# Patient Record
Sex: Female | Born: 1995 | Race: Black or African American | Hispanic: No | Marital: Single | Smoking: Never smoker
Health system: Southern US, Community
[De-identification: ages and names within clinical notes are randomized; demographics above are authoritative.]

---
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
## 2014-03-05 ENCOUNTER — Emergency Department (HOSPITAL_COMMUNITY)
Admission: EM | Admit: 2014-03-05 | Discharge: 2014-03-05 | Disposition: A | Discharge status: Home / Self Care | Payer: Medicaid Other | Attending: Emergency Medicine | Admitting: Emergency Medicine

## 2014-03-05 ENCOUNTER — Encounter (HOSPITAL_COMMUNITY): Payer: Self-pay

## 2014-03-05 DIAGNOSIS — M546 Pain in thoracic spine: Secondary | ICD-10-CM | POA: Insufficient documentation

## 2014-03-05 DIAGNOSIS — J029 Acute pharyngitis, unspecified: Secondary | ICD-10-CM | POA: Diagnosis not present

## 2014-03-05 DIAGNOSIS — Z79899 Other long term (current) drug therapy: Secondary | ICD-10-CM | POA: Insufficient documentation

## 2014-03-05 DIAGNOSIS — M7918 Myalgia, other site: Secondary | ICD-10-CM

## 2014-03-05 DIAGNOSIS — M549 Dorsalgia, unspecified: Secondary | ICD-10-CM

## 2014-03-05 DIAGNOSIS — M25512 Pain in left shoulder: Secondary | ICD-10-CM | POA: Diagnosis present

## 2014-03-05 LAB — RAPID STREP SCREEN (MED CTR MEBANE ONLY): Streptococcus, Group A Screen (Direct): NEGATIVE

## 2014-03-05 MED ORDER — IBUPROFEN 800 MG PO TABS: 800.0000 mg | ORAL_TABLET | Freq: Three times a day (TID) | ORAL | Status: DC | PRN

## 2014-03-05 MED ORDER — CYCLOBENZAPRINE HCL 10 MG PO TABS: 10.0000 mg | ORAL_TABLET | Freq: Three times a day (TID) | ORAL | Status: DC | PRN

## 2014-03-05 NOTE — ED Provider Notes (Signed)
CSN: [id number]     Arrival date & time 03/05/14  1310 History  This chart was scribed for non-physician practitioner, Trixie Dredge, PA-C, working with Elwin Mocha, MD by Charline Bills, ED Scribe. This patient was seen in room TR10C/TR10C and the patient's care was started at 2:35 PM.   Chief Complaint  Patient presents with  . Shoulder Pain  . Sore Throat   The history is provided by the patient. No language interpreter was used.   HPI Comments: Dorothy Sexton is a 19 y.o. female who presents to the Emergency Department complaining of constant, gradually worsening L shoulder pain for the past month. Pt describes pain as a constant, stabbing sensation that radiates from L shoulder across her upper back and into R shoulder. Pain is exacerbated with standing for extended periods of time. Pain is not recreated with deep breathing or walking. Pt suspected that pain was attributed to her bra strap. She denies heavy lifting or falls. Pt also denies neck pain, weakness, numbness. Pt is R hand dominant. She has tried treating with one of her mother's muscle relaxants which provided no relief and a heating pad which caused a burning sensation.   Pt also presents with a sudden onset of sore throat first noted PTA. Pt describes pain as soreness that is exacerbated with swallowing. Pt was treated for strep throat a few months ago. She denies fever, cough, congestion, SOB. No sick contacts.      History reviewed. No pertinent past medical history. History reviewed. No pertinent past surgical history. History reviewed. No pertinent family history. History  Substance Use Topics  . Smoking status: Never Smoker   . Smokeless tobacco: Not on file  . Alcohol Use: No   OB History    No data available     Review of Systems  Constitutional: Negative for fever.  HENT: Positive for sore throat. Negative for congestion.   Respiratory: Negative for cough and shortness of breath.   Musculoskeletal: Positive  for back pain and arthralgias. Negative for neck pain.  Neurological: Negative for weakness and numbness.  All other systems reviewed and are negative.  Allergies  Review of patient's allergies indicates no known allergies.  Home Medications   Prior to Admission medications   Medication Sig Start Date End Date Taking? Authorizing Provider  metroNIDAZOLE (FLAGYL) 500 MG tablet Take 1 tablet (500 mg total) by mouth 2 (two) times daily. 01/28/13   Imagene Sheller, MD   There were no vitals taken for this visit. Physical Exam  Constitutional: She appears well-developed and well-nourished. No distress.  HENT:  Head: Normocephalic and atraumatic.  Mouth/Throat: Oropharynx is clear and moist. No oropharyngeal exudate or posterior oropharyngeal erythema.  Bilateral symmetrical tonsil enlargement. No erythema or exudate.     Eyes: Conjunctivae are normal.  Neck: Neck supple.  Cardiovascular: Normal rate and regular rhythm.   Pulmonary/Chest: Effort normal and breath sounds normal. No respiratory distress. She has no wheezes. She has no rales.  Abdominal: Soft. She exhibits no distension and no mass. There is no tenderness. There is no rebound and no guarding.  Musculoskeletal:  L trapezius tender. Spine nontender, no crepitus, or stepoffs. Upper extremities:  Strength 5/5, sensation intact, distal pulses intact.       Neurological: She is alert.  Skin: She is not diaphoretic.  Nursing note reviewed.   ED Course  Procedures (including critical care time)   COORDINATION OF CARE: 2:41 PM-Discussed treatment plan which includes strep culture, strep screen, ibuprofen and  Flexeril with pt at bedside and pt agreed to plan.   Labs Review Labs Reviewed  RAPID STREP SCREEN  CULTURE, GROUP A STREP   Imaging Review No results found.   EKG Interpretation None      MDM   Final diagnoses:  Upper back pain on left side  Musculoskeletal pain  Sore throat    Afebrile, nontoxic  patient with muscular upper back pain. No red flags.  Also with early sore throat that began as she came into the ED.  No concerning findings on exam.  D/C home with ibuprofen, flexeril.  Discussed result, findings, treatment, and follow up  with patient.  Pt given return precautions.  Pt verbalizes understanding and agrees with plan.      I personally performed the services described in this documentation, which was scribed in my presence. The recorded information has been reviewed and is accurate.    Trixie Dredgemily Masami Plata, PA-C 03/05/14 1536  Elwin MochaBlair Walden, MD 03/05/14 [phone]

## 2014-03-05 NOTE — Discharge Instructions (Signed)
Read the information below.  Use the prescribed medication as directed.  Please discuss all new medications with your pharmacist.  You may return to the Emergency Department at any time for worsening condition or any new symptoms that concern you.  If there is any possibility that you might be pregnant, please let your health care provider know and discuss this with the pharmacist to ensure medication safety.  If you develop uncontrolled pain, weakness or numbness of the extremity, severe discoloration of the skin, or you are unable to move your arm, return to the ER for a recheck.

## 2014-03-05 NOTE — ED Notes (Signed)
Pt reporting left shoulder pain radiating to right shoulder for about a month.  Pt also reporting throat hurting her.  Sts she was recently treated for strep throat.

## 2014-03-07 LAB — CULTURE, GROUP A STREP

## 2014-10-22 ENCOUNTER — Emergency Department (HOSPITAL_COMMUNITY)
Admission: EM | Admit: 2014-10-22 | Discharge: 2014-10-23 | Disposition: A | Discharge status: Home / Self Care | Payer: Medicaid Other | Attending: Emergency Medicine | Admitting: Emergency Medicine

## 2014-10-22 DIAGNOSIS — R202 Paresthesia of skin: Secondary | ICD-10-CM

## 2014-10-22 DIAGNOSIS — Z79899 Other long term (current) drug therapy: Secondary | ICD-10-CM | POA: Diagnosis not present

## 2014-10-22 DIAGNOSIS — Z720 Tobacco use: Secondary | ICD-10-CM | POA: Diagnosis not present

## 2014-10-22 DIAGNOSIS — N9089 Other specified noninflammatory disorders of vulva and perineum: Secondary | ICD-10-CM | POA: Diagnosis not present

## 2014-10-23 ENCOUNTER — Encounter (HOSPITAL_COMMUNITY): Payer: Self-pay | Admitting: Emergency Medicine

## 2014-10-23 MED ORDER — IBUPROFEN 800 MG PO TABS: 800.0000 mg | ORAL_TABLET | Freq: Three times a day (TID) | ORAL | Status: DC

## 2014-10-23 NOTE — ED Notes (Signed)
Pt states she was sitting at her dresser and her right foot was under the edge of it and when she went to get up she hit it  Pt states the whole outside of her foot is numb   Pt states she also found a bump on her vagina while shaving she would like looked at

## 2014-10-23 NOTE — ED Provider Notes (Signed)
CSN: [id number]     Arrival date & time 10/22/14  2354 History   First MD Initiated Contact with Patient 10/23/14 0010     Chief Complaint  Patient presents with  . Foot Injury     (Consider location/radiation/quality/duration/timing/severity/associated sxs/prior Treatment) Patient is a 19 y.o. female presenting with foot injury. The history is provided by the patient. No language interpreter was used.  Foot Injury Time since incident:  1 week Injury: yes   Associated symptoms: no back pain and no fever   Associated symptoms comment:  She complains of numbness in the right foot laterally that caused a near fall earlier tonight prompting emergency evaluation. She reports that she injured the foot one week ago when it got caught underneath a dresser but there has been no numbness until tonight. No new injury.   She also complains of a bump on her labia that she noticed tonight while shaving the groin area. It is non-painful, has not had any drainage or blistering.    History reviewed. No pertinent past medical history. History reviewed. No pertinent past surgical history. Family History  Problem Relation Age of Onset  . Diabetes Mother   . Stroke Other   . Diabetes Other   . Hypertension Other    Social History  Substance Use Topics  . Smoking status: Current Some Day Smoker    Types: Cigarettes  . Smokeless tobacco: None  . Alcohol Use: Yes     Comment: occ   OB History    No data available     Review of Systems  Constitutional: Negative for fever.  Genitourinary:       See HPI.  Musculoskeletal: Negative for back pain.  Neurological: Positive for numbness. Negative for weakness.      Allergies  Review of patient's allergies indicates no known allergies.  Home Medications   Prior to Admission medications   Medication Sig Start Date End Date Taking? Authorizing Provider  cyclobenzaprine (FLEXERIL) 10 MG tablet Take 1 tablet (10 mg total) by mouth 3 (three)  times daily as needed for muscle spasms (and pain). 03/05/14   Trixie Dredge, PA-C  ibuprofen (ADVIL,MOTRIN) 800 MG tablet Take 1 tablet (800 mg total) by mouth every 8 (eight) hours as needed for mild pain or moderate pain. 03/05/14   Trixie Dredge, PA-C  metroNIDAZOLE (FLAGYL) 500 MG tablet Take 1 tablet (500 mg total) by mouth 2 (two) times daily. 01/28/13   Imagene Sheller, MD   BP 132/91 mmHg  Pulse 77  Temp(Src) 98.4 F (36.9 C) (Oral)  Resp 17  SpO2 100% Physical Exam  Constitutional: She is oriented to person, place, and time. She appears well-developed and well-nourished.  Neck: Normal range of motion.  Pulmonary/Chest: Effort normal.  Genitourinary:  Small, non-pustular soft bump left labia. Nontender. No induration.  Musculoskeletal:  Right foot is unremarkable in appearance. No swelling or bony deformity. FROM with full strength on plantar and dorsiflexion of toes.   Neurological: She is alert and oriented to person, place, and time.  Decreased sensation lateral right foot.   Skin: Skin is warm and dry.    ED Course  Procedures (including critical care time) Labs Review Labs Reviewed - No data to display  Imaging Review No results found. I have personally reviewed and evaluated these images and lab results as part of my medical decision-making.   EKG Interpretation None      MDM   Final diagnoses:  None    1. Right foot  paresthesia 2. Labial lesion  Labial lesion does not follow pattern of venereal wart. There is no pustule or blister. It is non-tender. Suggested observation and follow up if symptoms do not resolve in the next 1 week.   Foot is normal to exam. She is ambulatory. Suggested follow up with neurology if numbness continues for possible nerve conduction evaluation. Likely secondary to recent injury and self limited.     Elpidio Anis, PA-C 10/23/14 0034  April Palumbo, MD 10/23/14 [phone]

## 2014-10-23 NOTE — Discharge Instructions (Signed)

## 2014-12-02 ENCOUNTER — Emergency Department (HOSPITAL_COMMUNITY): Payer: Medicaid Other

## 2014-12-02 ENCOUNTER — Encounter (HOSPITAL_COMMUNITY): Payer: Self-pay | Admitting: Emergency Medicine

## 2014-12-02 ENCOUNTER — Emergency Department (HOSPITAL_COMMUNITY)
Admission: EM | Admit: 2014-12-02 | Discharge: 2014-12-02 | Disposition: A | Discharge status: Home / Self Care | Payer: Medicaid Other | Attending: Emergency Medicine | Admitting: Emergency Medicine

## 2014-12-02 DIAGNOSIS — S199XXA Unspecified injury of neck, initial encounter: Secondary | ICD-10-CM | POA: Insufficient documentation

## 2014-12-02 DIAGNOSIS — S66911A Strain of unspecified muscle, fascia and tendon at wrist and hand level, right hand, initial encounter: Secondary | ICD-10-CM

## 2014-12-02 DIAGNOSIS — S0033XA Contusion of nose, initial encounter: Secondary | ICD-10-CM | POA: Insufficient documentation

## 2014-12-02 DIAGNOSIS — S0083XA Contusion of other part of head, initial encounter: Secondary | ICD-10-CM | POA: Insufficient documentation

## 2014-12-02 DIAGNOSIS — F17210 Nicotine dependence, cigarettes, uncomplicated: Secondary | ICD-10-CM | POA: Diagnosis not present

## 2014-12-02 DIAGNOSIS — Y998 Other external cause status: Secondary | ICD-10-CM | POA: Insufficient documentation

## 2014-12-02 DIAGNOSIS — S66211A Strain of extensor muscle, fascia and tendon of right thumb at wrist and hand level, initial encounter: Secondary | ICD-10-CM | POA: Diagnosis not present

## 2014-12-02 DIAGNOSIS — S299XXA Unspecified injury of thorax, initial encounter: Secondary | ICD-10-CM | POA: Diagnosis not present

## 2014-12-02 DIAGNOSIS — Z792 Long term (current) use of antibiotics: Secondary | ICD-10-CM | POA: Diagnosis not present

## 2014-12-02 DIAGNOSIS — Y9389 Activity, other specified: Secondary | ICD-10-CM | POA: Diagnosis not present

## 2014-12-02 DIAGNOSIS — Y9289 Other specified places as the place of occurrence of the external cause: Secondary | ICD-10-CM | POA: Insufficient documentation

## 2014-12-02 DIAGNOSIS — Z791 Long term (current) use of non-steroidal anti-inflammatories (NSAID): Secondary | ICD-10-CM | POA: Diagnosis not present

## 2014-12-02 DIAGNOSIS — S6991XA Unspecified injury of right wrist, hand and finger(s), initial encounter: Secondary | ICD-10-CM | POA: Diagnosis present

## 2014-12-02 DIAGNOSIS — IMO0001 Reserved for inherently not codable concepts without codable children: Secondary | ICD-10-CM

## 2014-12-02 MED ORDER — CYCLOBENZAPRINE HCL 10 MG PO TABS: 10.0000 mg | ORAL_TABLET | Freq: Two times a day (BID) | ORAL | Status: DC | PRN

## 2014-12-02 MED ORDER — DIAZEPAM 2 MG PO TABS
2.0000 mg | ORAL_TABLET | Freq: Once | ORAL | Status: AC
  Administered 2014-12-02: 2 mg via ORAL
  Filled 2014-12-02: qty 1

## 2014-12-02 MED ORDER — OXYCODONE-ACETAMINOPHEN 5-325 MG PO TABS: 1.0000 | ORAL_TABLET | Freq: Four times a day (QID) | ORAL | Status: DC | PRN

## 2014-12-02 MED ORDER — HYDROCODONE-ACETAMINOPHEN 5-325 MG PO TABS
2.0000 | ORAL_TABLET | Freq: Once | ORAL | Status: AC
  Administered 2014-12-02: 2 via ORAL
  Filled 2014-12-02: qty 2

## 2014-12-02 MED ORDER — DIAZEPAM 5 MG PO TABS: 5.0000 mg | ORAL_TABLET | Freq: Two times a day (BID) | ORAL | Status: DC | PRN

## 2014-12-02 NOTE — Discharge Instructions (Signed)
Heat Therapy Heat therapy can help ease sore, stiff, injured, and tight muscles and joints. Heat relaxes your muscles, which may help ease your pain.  RISKS AND COMPLICATIONS If you have any of the following conditions, do not use heat therapy unless your health care provider has approved:  Poor circulation.  Healing wounds or scarred skin in the area being treated.  Diabetes, heart disease, or high blood pressure.  Not being able to feel (numbness) the area being treated.  Unusual swelling of the area being treated.  Active infections.  Blood clots.  Cancer.  Inability to communicate pain. This may include young children and people who have problems with their brain function (dementia).  Pregnancy. Heat therapy should only be used on old, pre-existing, or long-lasting (chronic) injuries. Do not use heat therapy on new injuries unless directed by your health care provider. HOW TO USE HEAT THERAPY There are several different kinds of heat therapy, including:  Moist heat pack.  Warm water bath.  Hot water bottle.  Electric heating pad.  Heated gel pack.  Heated wrap.  Electric heating pad. Use the heat therapy method suggested by your health care provider. Follow your health care provider's instructions on when and how to use heat therapy. GENERAL HEAT THERAPY RECOMMENDATIONS  Do not sleep while using heat therapy. Only use heat therapy while you are awake.  Your skin may turn pink while using heat therapy. Do not use heat therapy if your skin turns red.  Do not use heat therapy if you have new pain.  High heat or long exposure to heat can cause burns. Be careful when using heat therapy to avoid burning your skin.  Do not use heat therapy on areas of your skin that are already irritated, such as with a rash or sunburn. SEEK MEDICAL CARE IF:  You have blisters, redness, swelling, or numbness.  You have new pain.  Your pain is worse. MAKE SURE  YOU:  Understand these instructions.  Will watch your condition.  Will get help right away if you are not doing well or get worse.   This information is not intended to replace advice given to you by your health care provider. Make sure you discuss any questions you have with your health care provider.   Document Released: 03/30/2011 Document Revised: 01/26/2014 Document Reviewed: 02/28/2013 Elsevier Interactive Patient Education 2016 [employer] Inc[employer]  Cryotherapy Cryotherapy is when you put ice on your injury. Ice helps lessen pain and puffiness (swelling) after an injury. Ice works the best when you start using it in the first 24 to 48 hours after an injury. HOME CARE  Put a dry or damp towel between the ice pack and your skin.  You may press gently on the ice pack.  Leave the ice on for no more than 10 to 20 minutes at a time.  Check your skin after 5 minutes to make sure your skin is okay.  Rest at least 20 minutes between ice pack uses.  Stop using ice when your skin loses feeling (numbness).  Do not use ice on someone who cannot tell you when it hurts. This includes small children and people with memory problems (dementia). GET HELP RIGHT AWAY IF:  You have white spots on your skin.  Your skin turns blue or pale.  Your skin feels waxy or hard.  Your puffiness gets worse. MAKE SURE YOU:   Understand these instructions.  Will watch your condition.  Will get help right away if you  are not doing well or get worse.   This information is not intended to replace advice given to you by your health care provider. Make sure you discuss any questions you have with your health care provider.   Document Released: 06/24/2007 Document Revised: 03/30/2011 Document Reviewed: 08/28/2010 Elsevier Interactive Patient Education 2016 Else[employer] Inc.  Musculoskeletal Pain Musculoskeletal pain is muscle and boney aches and pains. These pains can occur in any part of the body. Your  caregiver may treat you without knowing the cause of the pain. They may treat you if blood or urine tests, X-rays, and other tests were normal.  CAUSES There is often not a definite cause or reason for these pains. These pains may be caused by a type of germ (virus). The discomfort may also come from overuse. Overuse includes working out too hard when your body is not fit. Boney aches also come from weather changes. Bone is sensitive to atmospheric pressure changes. HOME CARE INSTRUCTIONS   Ask when your test results will be ready. Make sure you get your test results.  Only take over-the-counter or prescription medicines for pain, discomfort, or fever as directed by your caregiver. If you were given medications for your condition, do not drive, operate machinery or power tools, or sign legal documents for 24 hours. Do not drink alcohol. Do not take sleeping pills or other medications that may interfere with treatment.  Continue all activities unless the activities cause more pain. When the pain lessens, slowly resume normal activities. Gradually increase the intensity and duration of the activities or exercise.  During periods of severe pain, bed rest may be helpful. Lay or sit in any position that is comfortable.  Putting ice on the injured area.  Put ice in a bag.  Place a towel between your skin and the bag.  Leave the ice on for 15 to 20 minutes, 3 to 4 times a day.  Follow up with your caregiver for continued problems and no reason can be found for the pain. If the pain becomes worse or does not go away, it may be necessary to repeat tests or do additional testing. Your caregiver may need to look further for a possible cause. SEEK IMMEDIATE MEDICAL CARE IF:  You have pain that is getting worse and is not relieved by medications.  You develop chest pain that is associated with shortness or breath, sweating, feeling sick to your stomach (nauseous), or throw up (vomit).  Your pain  becomes localized to the abdomen.  You develop any new symptoms that seem different or that concern you. MAKE SURE YOU:   Understand these instructions.  Will watch your condition.  Will get help right away if you are not doing well or get worse.   This information is not intended to replace advice given to you by your health care provider. Make sure you discuss any questions you have with your health care provider.   Document Released: 01/05/2005 Document Revised: 03/30/2011 Document Reviewed: 09/09/2012 Elsevier Interactive Patient Education 2016 Else[employer] Inc.  Thumb Sprain A thumb sprain is an injury to one of the strong bands of tissue (ligaments) that connect the bones in your thumb. The ligament can be stretched too much or it can tear. A tear can be either partial or complete. The severity of the sprain depends on how much of the ligament was damaged or torn. CAUSES A thumb sprain is often caused by a fall or an accident. If you extend your hands to catch  an object or to protect yourself, the force of the impact can cause your ligament to stretch too much. This excess tension can also cause your ligament to tear. RISK FACTORS This injury is more likely to occur in people who play:  Sports that involve a greater risk of falling, such as skiing.  Sports that involve catching an object, such as basketball. SYMPTOMS Symptoms of this condition include:  Loss of motion in your thumb.  Bruising.  Tenderness.  Swelling. DIAGNOSIS This condition is diagnosed with a medical history and physical exam. You may also have an X-ray of your thumb. TREATMENT Treatment varies depending on the severity of your sprain. If your ligament is overstretched or partially torn, treatment usually involves keeping your thumb in a fixed position (immobilization) for a period of time. To help you do this, your health care provider will apply a bandage, cast, or splint to keep your thumb from moving  until it heals. If your ligament is fully torn, you may need surgery to reconnect the ligament to the bone. After surgery, a cast or splint will be applied and will need to stay on your thumb while it heals. Your health care provider may also suggest exercises or physical therapy to strengthen your thumb. HOME CARE INSTRUCTIONS If You Have a Cast:  Do not stick anything inside the cast to scratch your skin. Doing that increases your risk of infection.  Check the skin around the cast every day. Report any concerns to your health care provider. You may put lotion on dry skin around the edges of the cast. Do not apply lotion to the skin underneath the cast.  Keep the cast clean and dry. If You Have a Splint:  Wear it as directed by your health care provider. Remove it only as directed by your health care provider.  Loosen the splint if your fingers become numb and tingle, or if they turn cold and blue.  Keep the splint clean and dry. Bathing  Cover the bandage, cast, or splint with a watertight plastic bag to protect it from water while you take a bath or a shower. Do not let the bandage, cast, or splint get wet. Managing Pain, Stiffness, and Swelling   If directed, apply ice to the injured area (unless you have a cast):  Put ice in a plastic bag.  Place a towel between your skin and the bag.  Leave the ice on for 20 minutes, 2-3 times per day.  Move your fingers often to avoid stiffness and to lessen swelling.  Raise (elevate) the injured area above the level of your heart while you are sitting or lying down. Driving  Do not drive or operate heavy machinery while taking pain medicine.  Do not drive while wearing a cast or splint on a hand that you use for driving. General Instructions  Do not put pressure on any part of your cast or splint until it is fully hardened. This may take several hours.  Take medicines only as directed by your health care provider. These include  over-the-counter medicines and prescription medicines.  Keep all follow-up visits as directed by your health care provider. This is important.  Do any exercise or physical therapy as directed by your health care provider.  Do not wear rings on your injured thumb. SEEK MEDICAL CARE IF:  Your pain is not controlled with medicine.  Your bruising or swelling gets worse.  Your cast or splint is damaged. SEEK IMMEDIATE MEDICAL CARE IF:  Your thumb is numb or blue.  Your thumb feels colder than normal.   This information is not intended to replace advice given to you by your health care provider. Make sure you discuss any questions you have with your health care provider.   Document Released: 02/13/2004 Document Revised: 05/22/2014 Document Reviewed: 10/17/2013 Elsevier Interactive Patient Education 2016 Else[employer] Inc.  Jaw Contusion A jaw contusion is a deep bruise of the jaw. Contusions are the result of an injury to muscles and tissue under the skin, which causes bleeding under the skin. The contusion may turn blue, purple, or yellow. Minor injuries will cause a painless contusion, but more severe contusions may stay painful and swollen for a few weeks. CAUSES This condition is usually caused by trauma, direct force, or a hard hit (blow) to the jaw. SYMPTOMS Symptoms of this condition include:  Jaw pain.  Jaw swelling.  Jaw bruising, redness, or discoloration.  Jaw tenderness or soreness. DIAGNOSIS This condition is diagnosed with a medical history and a physical exam. An X-ray, CT scan, or MRI may be needed to determine if there were any associated injuries, such as broken bones (fractures). TREATMENT Often, the best treatment for this condition is applying cold compresses to the injured area and eating a soft diet. Over-the-counter medicines may also be recommended for pain control. HOME CARE INSTRUCTIONS Diet  Eat soft foods as told by your health care provider. Soft  foods include baby food, gelatin, oatmeal, ice cream, applesauce, bananas, eggs, pasta, cottage cheese, soups, and yogurt.  Cut food into smaller pieces. This makes it easier to chew.  Avoid chewing gum or ice. General Instructions  If directed, apply ice to the injured area:  Put ice in a plastic bag.  Place a towel between your skin and the bag.  Leave the ice on for 20 minutes, 2-3 times per day.  Take over-the-counter and prescription medicines only as told by your health care provider.  Avoid opening your mouth widely. This includes opening your mouth to eat large pieces of food or to yawn, scream, yell, or sing.  Keep all follow-up visits as told by your health care provider. This is important. SEEK MEDICAL CARE IF:  Your pain is not controlled with medicine.  Your symptoms do not improve with treatment or they get worse.  You have new symptoms. SEEK IMMEDIATE MEDICAL CARE IF:  You have any new cracking or clicking in your jaw.  You have trouble eating or you cannot eat.   This information is not intended to replace advice given to you by your health care provider. Make sure you discuss any questions you have with your health care provider.   Document Released: 03/28/2003 Document Revised: 09/26/2014 Document Reviewed: 04/02/2014 Elsevier Interactive Patient Education 2016 Else[employer] Inc.  Facial or Scalp Contusion  A facial or scalp contusion is a deep bruise on the face or head. Contusions happen when an injury causes bleeding under the skin. Signs of bruising include pain, puffiness (swelling), and discolored skin. The contusion may turn blue, purple, or yellow. HOME CARE  Only take medicines as told by your doctor.  Put ice on the injured area.  Put ice in a plastic bag.  Place a towel between your skin and the bag.  Leave the ice on for 20 minutes, 2-3 times a day. GET HELP IF:  You have bite problems.  You have pain when chewing.  You are worried  about your face not healing normally. GET HELP RIGHT  AWAY IF:   You have severe pain or a headache and medicine does not help.  You are very tired or confused, or your personality changes.  You throw up (vomit).  You have a nosebleed that will not stop.  You see two of everything (double vision) or have blurry vision.  You have fluid coming from your nose or ear.  You have problems walking or using your arms or legs. MAKE SURE YOU:   Understand these instructions.  Will watch your condition.  Will get help right away if you are not doing well or get worse.   This information is not intended to replace advice given to you by your health care provider. Make sure you discuss any questions you have with your health care provider.   Document Released: 12/25/2010 Document Revised: 01/26/2014 Document Reviewed: 08/18/2012 Elsevier Interactive Patient Education 2016 Else[employer] Inc.  Adult nurse and RICE WHAT DOES AN ELASTIC BANDAGE DO? Elastic bandages come in different shapes and sizes. They generally provide support to your injury and reduce swelling while you are healing, but they can perform different functions. Your health care provider will help you to decide what is best for your protection, recovery, or rehabilitation following an injury. WHAT ARE SOME GENERAL TIPS FOR USING AN ELASTIC BANDAGE?  Use the bandage as directed by the maker of the bandage that you are using.  Do not wrap the bandage too tightly. This may cut off the circulation in the arm or leg in the area below the bandage.  If part of your body beyond the bandage becomes blue, numb, cold, swollen, or is more painful, your bandage is most likely too tight. If this occurs, remove your bandage and reapply it more loosely.  See your health care provider if the bandage seems to be making your problems worse rather than better.  An elastic bandage should be removed and reapplied every 3-4 hours or as directed by  your health care provider. WHAT IS RICE? The routine care of many injuries includes rest, ice, compression, and elevation (RICE therapy).  Rest Rest is required to allow your body to heal. Generally, you can resume your routine activities when you are comfortable and have been given permission by your health care provider. Ice Icing your injury helps to keep the swelling down and it reduces pain. Do not apply ice directly to your skin.  Put ice in a plastic bag.  Place a towel between your skin and the bag.  Leave the ice on for 20 minutes, 2-3 times per day. Do this for as long as you are directed by your health care provider. Compression Compression helps to keep swelling down, gives support, and helps with discomfort. Compression may be done with an elastic bandage. Elevation Elevation helps to reduce swelling and it decreases pain. If possible, your injured area should be placed at or above the level of your heart or the center of your chest. WHEN SHOULD I SEEK MEDICAL CARE? You should seek medical care if:  You have persistent pain and swelling.  Your symptoms are getting worse rather than improving. These symptoms may indicate that further evaluation or further X-rays are needed. Sometimes, X-rays may not show a small broken bone (fracture) until a number of days later. Make a follow-up appointment with your health care provider. Ask when your X-ray results will be ready. Make sure that you get your X-ray results. WHEN SHOULD I SEEK IMMEDIATE MEDICAL CARE? You should seek immediate medical care if:  You have a sudden onset of severe pain at or below the area of your injury.  You develop redness or increased swelling around your injury.  You have tingling or numbness at or below the area of your injury that does not improve after you remove the elastic bandage.   This information is not intended to replace advice given to you by your health care provider. Make sure you discuss  any questions you have with your health care provider.   Document Released: 06/27/2001 Document Revised: 09/26/2014 Document Reviewed: 08/21/2013 Elsevier Interactive Patient Education 2016 Else[employer] Inc.  Head Injury, Adult You have a head injury. Headaches and throwing up (vomiting) are common after a head injury. It should be easy to wake up from sleeping. Sometimes you must stay in the hospital. Most problems happen within the first 24 hours. Side effects may occur up to 7-10 days after the injury.  WHAT ARE THE TYPES OF HEAD INJURIES? Head injuries can be as minor as a bump. Some head injuries can be more severe. More severe head injuries include:  A jarring injury to the brain (concussion).  A bruise of the brain (contusion). This mean there is bleeding in the brain that can cause swelling.  A cracked skull (skull fracture).  Bleeding in the brain that collects, clots, and forms a bump (hematoma). WHEN SHOULD I GET HELP RIGHT AWAY?   You are confused or sleepy.  You cannot be woken up.  You feel sick to your stomach (nauseous) or keep throwing up (vomiting).  Your dizziness or unsteadiness is getting worse.  You have very bad, lasting headaches that are not helped by medicine. Take medicines only as told by your doctor.  You cannot use your arms or legs like normal.  You cannot walk.  You notice changes in the black spots in the center of the colored part of your eye (pupil).  You have clear or bloody fluid coming from your nose or ears.  You have trouble seeing. During the next 24 hours after the injury, you must stay with someone who can watch you. This person should get help right away (call 911 in the [place] if you start to shake and are not able to control it (have seizures), you pass out, or you are unable to wake up. HOW CAN I PREVENT A HEAD INJURY IN THE FUTURE?  Wear seat belts.  Wear a helmet while bike riding and playing sports like football.  Stay away  from dangerous activities around the house. WHEN CAN I RETURN TO NORMAL ACTIVITIES AND ATHLETICS? See your doctor before doing these activities. You should not do normal activities or play contact sports until 1 week after the following symptoms have stopped:  Headache that does not go away.  Dizziness.  Poor attention.  Confusion.  Memory problems.  Sickness to your stomach or throwing up.  Tiredness.  Fussiness.  Bothered by bright lights or loud noises.  Anxiousness or depression.  Restless sleep. MAKE SURE YOU:   Understand these instructions.  Will watch your condition.  Will get help right away if you are not doing well or get worse.   This information is not intended to replace advice given to you by your health care provider. Make sure you discuss any questions you have with your health care provider.   Document Released: 12/19/2007 Document Revised: 01/26/2014 Document Reviewed: 09/12/2012 Elsevier Interactive Patient Education 2016 [employer].  General Assault Assault includes any behavior or physical attack--whether it is  on purpose or not--that results in injury to another person, damage to property, or both. This also includes assault that has not yet happened, but is planned to happen. Threats of assault may be physical, verbal, or written. They may be said or sent by:  Mail.  E-mail.  Text.  Social media.  Fax. The threats may be direct, implied, or understood. WHAT ARE THE DIFFERENT FORMS OF ASSAULT? Forms of assault include:  Physically assaulting a person. This includes physical threats to inflict physical harm as well as:  Slapping.  Hitting.  Poking.  Kicking.  Punching.  Pushing.  Sexually assaulting a person. Sexual assault is any sexual activity that a person is forced, threatened, or coerced to participate in. It may or may not involve physical contact with the person who is assaulting you. You are sexually assaulted if  you are forced to have sexual contact of any kind.  Damaging or destroying a person's assistive equipment, such as glasses, canes, or walkers.  Throwing or hitting objects.  Using or displaying a weapon to harm or threaten someone.  Using or displaying an object that appears to be a weapon in a threatening manner.  Using greater physical size or strength to intimidate someone.  Making intimidating or threatening gestures.  Bullying.  Hazing.  Using language that is intimidating, threatening, hostile, or abusive.  Stalking.  Restraining someone with force. WHAT SHOULD I DO IF I EXPERIENCE ASSAULT?  Report assaults, threats, and stalking to the police. Call your local emergency services (911 in the [place] if you are in immediate danger or you need medical help.  You can work with a [employer] or an advocate to get legal protection against someone who has assaulted you or threatened you with assault. Protection includes restraining orders and private addresses. Crimes against you, such as assault, can also be prosecuted through the courts. Laws will vary depending on where you live.   This information is not intended to replace advice given to you by your health care provider. Make sure you discuss any questions you have with your health care provider.   Document Released: 01/05/2005 Document Revised: 01/26/2014 Document Reviewed: 09/22/2013 Elsevier Interactive Patient Education [employer] Inc.
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
## 2015-08-08 ENCOUNTER — Emergency Department (HOSPITAL_COMMUNITY): Payer: Medicaid Other

## 2015-08-08 ENCOUNTER — Emergency Department (HOSPITAL_COMMUNITY)
Admission: EM | Admit: 2015-08-08 | Discharge: 2015-08-08 | Disposition: A | Discharge status: Home / Self Care | Payer: Medicaid Other | Attending: Emergency Medicine | Admitting: Emergency Medicine

## 2015-08-08 ENCOUNTER — Encounter (HOSPITAL_COMMUNITY): Payer: Self-pay

## 2015-08-08 DIAGNOSIS — W228XXA Striking against or struck by other objects, initial encounter: Secondary | ICD-10-CM | POA: Diagnosis not present

## 2015-08-08 DIAGNOSIS — Y929 Unspecified place or not applicable: Secondary | ICD-10-CM | POA: Diagnosis not present

## 2015-08-08 DIAGNOSIS — Y999 Unspecified external cause status: Secondary | ICD-10-CM | POA: Insufficient documentation

## 2015-08-08 DIAGNOSIS — F17210 Nicotine dependence, cigarettes, uncomplicated: Secondary | ICD-10-CM | POA: Diagnosis not present

## 2015-08-08 DIAGNOSIS — S91209A Unspecified open wound of unspecified toe(s) with damage to nail, initial encounter: Secondary | ICD-10-CM

## 2015-08-08 DIAGNOSIS — Y939 Activity, unspecified: Secondary | ICD-10-CM | POA: Insufficient documentation

## 2015-08-08 DIAGNOSIS — S91202A Unspecified open wound of left great toe with damage to nail, initial encounter: Secondary | ICD-10-CM | POA: Insufficient documentation

## 2015-08-08 MED ORDER — IBUPROFEN 200 MG PO TABS
600.0000 mg | ORAL_TABLET | Freq: Once | ORAL | Status: AC
  Administered 2015-08-08: 600 mg via ORAL
  Filled 2015-08-08: qty 3

## 2015-08-08 MED ORDER — BUPIVACAINE HCL (PF) 0.5 % IJ SOLN
30.0000 mL | Freq: Once | INTRAMUSCULAR | Status: DC
  Filled 2015-08-08: qty 30

## 2015-08-08 NOTE — Discharge Instructions (Signed)
Fingernail or Toenail Removal Fingernail or toenail removal is a surgical procedure to take off a nail from your finger or your toe. You may need to have a fingernail or toenail removed if it has an abnormal shape (deformity) or if it is severely injured. A fingernail or toenail may also be removed due to a bacterial infection, a severe ingrown toenail, or a fungal infection that has failed treatment with antifungal medicines. LET [facility] CARE PROVIDER KNOW ABOUT:  Any allergies you have.  All medicines you are taking, including vitamins, herbs, eye drops, creams, and over-the-counter medicines.  Previous problems you or members of your family have had with the use of anesthetics.  Any blood disorders you have.  Previous surgeries you have had.  Any medical conditions you may have. RISKS AND COMPLICATIONS Generally, this is a safe procedure. However, problems may occur, including:  Pain.  Bleeding.  Infection.  Regrowth of a deformed nail. BEFORE THE PROCEDURE  Ask your health care provider about changing or stopping your regular medicines. This is especially important if you are taking diabetes medicines or blood thinners.  Follow instructions from your health care provider about eating or drinking restrictions.  Plan to have someone take you home after the procedure. PROCEDURE  An IV tube will be inserted into one of your veins.  You will be given one or more of the following:  A medicine that helps you relax (sedative).  A medicine that numbs the area (local anesthetic).  After your toe or finger is numb, your health care provider will insert a blunt instrument under your nail to lift it up.  In some cases, your health care provider may also make a cut (incision) in your nail.  After your nail is lifted away from your toe or finger, your health care provider will detach it from your nail bed.  A germ-killing bandage (antiseptic dressing) will be put on your toe  or finger. The procedure may vary among health care providers and hospitals. AFTER THE PROCEDURE  Your blood pressure, heart rate, breathing rate, and blood oxygen level will be monitored often until the medicines you were given have worn off.  It is common to have some pain after nail removal. You will be given pain medicine as needed.  You may be given a prescription for pain medicine and antibiotic medicine.  If you had a toenail removed, you will be given a surgical shoe to wear while you recover.  If you had a fingernail removed, you may be given a finger splint to wear while you recover.   This information is not intended to replace advice given to you by your health care provider. Make sure you discuss any questions you have with your health care provider.   Document Released: 10/04/2002 Document Revised: 05/22/2014 Document Reviewed: 01/03/2014 Elsevier Interactive Patient Education 2016 [employer]  Nail Avulsion Injury Nail avulsion means that you have lost the whole, or part of a nail. The nail will usually grow back in 2 to 6 months. If your injury damaged the growth center of the nail, the nail may be deformed, split, or not stuck to the nail bed. Sometimes the avulsed nail is stitched back in place. This provides temporary protection to the nail bed until the new nail grows in.  HOME CARE INSTRUCTIONS   Raise (elevate) your injury as much as possible.  Protect the injury and cover it with bandages (dressings) or splints as instructed.  Change dressings as instructed.  SEEK MEDICAL CARE IF:   There is increasing pain, redness, or swelling.  You cannot move your fingers or toes.   This information is not intended to replace advice given to you by your health care provider. Make sure you discuss any questions you have with your health care provider.   Keep toenail covered as much as possible. Your toenail will likely fall off. Take ibuprofen as needed for pain. Follow  up with PCP as needed. Return to the ED if you experience severe worsening of your symptoms, redness or swelling around your toe, fevers or chills.

## 2015-08-08 NOTE — ED Provider Notes (Signed)
CSN: [id number]     Arrival date & time 08/08/15  1228 History   First MD Initiated Contact with Patient 08/08/15 1315     Chief Complaint  Patient presents with  . Toe Injury     (Consider location/radiation/quality/duration/timing/severity/associated sxs/prior Treatment) HPI   Calisa Luckenbaugh is a 20 year old female with no significant past medical history who presents to the ED today complaining of a toe injury. Patient states that she accidentally stubbed her left great toe on a dresser 1 hour prior to arrival. She has bleeding coming from her toe and her toenail was bent backwards. Patient denies any other trauma or injury. She has not taken any medications prior to arrival.  History reviewed. No pertinent past medical history. History reviewed. No pertinent past surgical history. Family History  Problem Relation Age of Onset  . Diabetes Mother   . Stroke Other   . Diabetes Other   . Hypertension Other    Social History  Substance Use Topics  . Smoking status: Current Some Day Smoker    Types: Cigarettes  . Smokeless tobacco: None  . Alcohol Use: Yes     Comment: occ   OB History    No data available     Review of Systems  All other systems reviewed and are negative.     Allergies  Review of patient's allergies indicates no known allergies.  Home Medications   Prior to Admission medications   Medication Sig Start Date End Date Taking? Authorizing Provider  cyclobenzaprine (FLEXERIL) 10 MG tablet Take 1 tablet (10 mg total) by mouth 2 (two) times daily as needed for muscle spasms. 12/02/14   Danelle Berry, PA-C  diazepam (VALIUM) 5 MG tablet Take 1 tablet (5 mg total) by mouth every 12 (twelve) hours as needed for anxiety. 12/02/14   Danelle Berry, PA-C  ibuprofen (ADVIL,MOTRIN) 800 MG tablet Take 1 tablet (800 mg total) by mouth 3 (three) times daily. 10/23/14   Elpidio Anis, PA-C  metroNIDAZOLE (FLAGYL) 500 MG tablet Take 1 tablet (500 mg total) by mouth 2 (two)  times daily. 01/28/13   Imagene Sheller, MD  oxyCODONE-acetaminophen (PERCOCET/ROXICET) 5-325 MG tablet Take 1-2 tablets by mouth every 6 (six) hours as needed for severe pain. 12/02/14   Danelle Berry, PA-C   BP 108/62 mmHg  Pulse 83  Temp(Src) 99.2 F (37.3 C) (Oral)  Resp 18  SpO2 98% Physical Exam  Constitutional: She is oriented to person, place, and time. She appears well-developed and well-nourished. No distress.  HENT:  Head: Normocephalic and atraumatic.  Eyes: Conjunctivae are normal. Right eye exhibits no discharge. Left eye exhibits no discharge. No scleral icterus.  Cardiovascular: Normal rate and intact distal pulses.   Pulmonary/Chest: Effort normal.  Musculoskeletal:  No decrease ROM of R great toe. No obvious bony deformity.  Neurological: She is alert and oriented to person, place, and time. Coordination normal.  No sensory deficit  Skin: Skin is warm and dry. No rash noted. She is not diaphoretic. No erythema. No pallor.  R great toe nail bed avulsion. Nail fold/bed still intact.  Psychiatric: She has a normal mood and affect. Her behavior is normal.  Nursing note and vitals reviewed.   ED Course  .Nerve Block Date/Time: 08/08/2015 8:45 PM Performed by: Gaylyn Rong TRIPP Authorized by: Gaylyn Rong TRIPP Consent: Verbal consent obtained. Risks and benefits: risks, benefits and alternatives were discussed Consent given by: patient Patient understanding: patient states understanding of the procedure being performed Patient identity confirmed: verbally  with patient Indications comments: tacking down toe nail after avulsion Body area: lower extremity Nerve: digital Laterality: left Patient sedated: no Patient position: supine Needle gauge: 25 G Location technique: anatomical landmarks Local anesthetic: bupivacaine 0.5% without epinephrine Outcome: pain improved Patient tolerance: Patient tolerated the procedure well with no immediate complications    (including critical care time)   Labs Review Labs Reviewed - No data to display  Imaging Review Dg Foot Complete Left  08/08/2015  CLINICAL DATA:  The patient jammed her left great toe on a dresser today with onset of pain and bleeding. Initial encounter. EXAM: LEFT FOOT - COMPLETE 3+ VIEW COMPARISON:  None. FINDINGS: There is no evidence of fracture or dislocation. There is no evidence of arthropathy or other focal bone abnormality. Soft tissues are unremarkable. IMPRESSION: Negative exam. Electronically Signed   By: Drusilla Kannerhomas  Dalessio M.D.   On: 08/08/2015 14:11   I have personally reviewed and evaluated these images and lab results as part of my medical decision-making.   EKG Interpretation None      MDM   Final diagnoses:  Toenail avulsion, initial encounter   Pt presents to the ED with a left great toe nail avulsion after striking it on a dresser. Nail fold still intact. Digital block performed. Pt's nails too thick to suture nail in place. Pressure to nail applied and dermabonded in place. Steri strips then placed on top to secure nail. Toe nail will likely fall off on it's own. Follow up with PCP for a wound re-check. Return precautions outlined in patient discharge instructions.      Lester KinsmanSamantha Tripp [place], PA-C 08/08/15 2049  Pricilla LovelessScott Goldston, MD 08/09/15 [phone]

## 2015-08-08 NOTE — ED Notes (Signed)
Pt presents with c/o left big toe injury. Pt reports she jammed her toe on the dresser and it started to bleed. NAD.

## 2015-11-29 ENCOUNTER — Encounter (HOSPITAL_COMMUNITY): Payer: Self-pay | Admitting: Emergency Medicine

## 2015-11-29 ENCOUNTER — Emergency Department (HOSPITAL_COMMUNITY)
Admission: EM | Admit: 2015-11-29 | Discharge: 2015-11-29 | Disposition: A | Discharge status: Home / Self Care | Payer: Medicaid Other | Attending: Emergency Medicine | Admitting: Emergency Medicine

## 2015-11-29 DIAGNOSIS — R1010 Upper abdominal pain, unspecified: Secondary | ICD-10-CM | POA: Diagnosis not present

## 2015-11-29 DIAGNOSIS — Z79899 Other long term (current) drug therapy: Secondary | ICD-10-CM | POA: Insufficient documentation

## 2015-11-29 DIAGNOSIS — R1013 Epigastric pain: Secondary | ICD-10-CM | POA: Diagnosis present

## 2015-11-29 DIAGNOSIS — Z791 Long term (current) use of non-steroidal anti-inflammatories (NSAID): Secondary | ICD-10-CM | POA: Diagnosis not present

## 2015-11-29 LAB — COMPREHENSIVE METABOLIC PANEL
ALBUMIN: 4.9 g/dL (ref 3.5–5.0)
ALT: 90 U/L — ABNORMAL HIGH (ref 14–54)
AST: 48 U/L — ABNORMAL HIGH (ref 15–41)
Alkaline Phosphatase: 67 U/L (ref 38–126)
Anion gap: 6 (ref 5–15)
BILIRUBIN TOTAL: 0.4 mg/dL (ref 0.3–1.2)
BUN: 13 mg/dL (ref 6–20)
CALCIUM: 9.3 mg/dL (ref 8.9–10.3)
CO2: 24 mmol/L (ref 22–32)
Chloride: 107 mmol/L (ref 101–111)
Creatinine, Ser: 0.75 mg/dL (ref 0.44–1.00)
GFR calc non Af Amer: >60 mL/min (ref >60)
GLUCOSE: 85 mg/dL (ref 65–99)
Potassium: 3.6 mmol/L (ref 3.5–5.1)
Sodium: 137 mmol/L (ref 135–145)
Total Protein: 9.1 g/dL — ABNORMAL HIGH (ref 6.5–8.1)

## 2015-11-29 LAB — CBC
HCT: 42.6 % (ref 36.0–46.0)
Hemoglobin: 14.3 g/dL (ref 12.0–15.0)
MCH: 30.6 pg (ref 26.0–34.0)
MCHC: 33.6 g/dL (ref 30.0–36.0)
MCV: 91 fL (ref 78.0–100.0)
PLATELETS: 161 10*3/uL (ref 150–400)
RBC: 4.68 MIL/uL (ref 3.87–5.11)
RDW: 13 % (ref 11.5–15.5)
WBC: 5.5 10*3/uL (ref 4.0–10.5)

## 2015-11-29 LAB — URINALYSIS, ROUTINE W REFLEX MICROSCOPIC
Bilirubin Urine: NEGATIVE
GLUCOSE, UA: NEGATIVE mg/dL
HGB URINE DIPSTICK: NEGATIVE
KETONES UR: NEGATIVE mg/dL
Leukocytes, UA: NEGATIVE
Nitrite: NEGATIVE
PH: 6 (ref 5.0–8.0)
PROTEIN: NEGATIVE mg/dL
Specific Gravity, Urine: 1.013 (ref 1.005–1.030)

## 2015-11-29 LAB — I-STAT BETA HCG BLOOD, ED (MC, WL, AP ONLY): I-stat hCG, quantitative: <5 m[IU]/mL (ref <5)

## 2015-11-29 LAB — LIPASE, BLOOD: Lipase: 19 U/L (ref 11–51)

## 2015-11-29 MED ORDER — GI COCKTAIL ~~LOC~~
30.0000 mL | Freq: Once | ORAL | Status: AC
  Administered 2015-11-29: 30 mL via ORAL
  Filled 2015-11-29: qty 30

## 2015-11-29 MED ORDER — ONDANSETRON 8 MG PO TBDP
8.0000 mg | ORAL_TABLET | Freq: Once | ORAL | Status: AC
  Administered 2015-11-29: 8 mg via ORAL
  Filled 2015-11-29: qty 1

## 2015-11-29 MED ORDER — FAMOTIDINE 20 MG PO TABS
40.0000 mg | ORAL_TABLET | Freq: Once | ORAL | Status: AC
  Administered 2015-11-29: 40 mg via ORAL
  Filled 2015-11-29: qty 2

## 2015-11-29 NOTE — Discharge Instructions (Signed)
You have a mild elevation of your liver function test today. Follow-up with your Dr. for this

## 2015-11-29 NOTE — ED Provider Notes (Signed)
[facility]-EMERGENCY DEPT Provider Note   CSN: [id number] Arrival date & time: 11/29/15  0749     History   Chief Complaint Chief Complaint  Patient presents with  . Abdominal Pain  . Diarrhea    HPI Guadalupe DawnKhalia Bunch is a 20 y.o. female.  20 year old female presents with 2 day history of crampy abdominal pain characterized as epigastric region and described as being worse with eating or drinking. Has had loose stools but denies any vomiting. Took Motrin with temporary relief. Denies any fever or chills. No vaginal bleeding or discharge. No flank discomfort. No urinary symptoms. Denies any sick contacts or bad food exposures. Symptoms have been improving. Prior history of same associated with gastroenteritis. No cough or congestion.      History reviewed. No pertinent past medical history.  There are no active problems to display for this patient.   History reviewed. No pertinent surgical history.  OB History    No data available       Home Medications    Prior to Admission medications   Medication Sig Start Date End Date Taking? Authorizing Provider  etonogestrel (NEXPLANON) 68 MG IMPL implant 1 each by Subdermal route once. Placed in 05/2013   Yes Historical Provider, MD  naproxen sodium (ANAPROX) 220 MG tablet Take 440 mg by mouth every 12 (twelve) hours as needed (for pain).   Yes Historical Provider, MD    Family History Family History  Problem Relation Age of Onset  . Stroke Other   . Diabetes Other   . Hypertension Other   . Diabetes Mother     Social History Social History  Substance Use Topics  . Smoking status: Never Smoker  . Smokeless tobacco: Not on file  . Alcohol use No     Comment: occ     Allergies   Patient has no known allergies.   Review of Systems Review of Systems  All other systems reviewed and are negative.    Physical Exam Updated Vital Signs BP 112/89 (BP Location: Right Arm)   Pulse 86   Temp 98.1 F (36.7 C) (Oral)    Resp 16   SpO2 97%   Physical Exam  Constitutional: She is oriented to person, place, and time. She appears well-developed and well-nourished.  Non-toxic appearance. No distress.  HENT:  Head: Normocephalic and atraumatic.  Eyes: Conjunctivae, EOM and lids are normal. Pupils are equal, round, and reactive to light.  Neck: Normal range of motion. Neck supple. No tracheal deviation present. No thyroid mass present.  Cardiovascular: Normal rate, regular rhythm and normal heart sounds.  Exam reveals no gallop.   No murmur heard. Pulmonary/Chest: Effort normal and breath sounds normal. No stridor. No respiratory distress. She has no decreased breath sounds. She has no wheezes. She has no rhonchi. She has no rales.  Abdominal: Soft. Normal appearance and bowel sounds are normal. She exhibits no distension. There is no tenderness. There is no rebound and no CVA tenderness.  Musculoskeletal: Normal range of motion. She exhibits no edema or tenderness.  Neurological: She is alert and oriented to person, place, and time. She has normal strength. No cranial nerve deficit or sensory deficit. GCS eye subscore is 4. GCS verbal subscore is 5. GCS motor subscore is 6.  Skin: Skin is warm and dry. No abrasion and no rash noted.  Psychiatric: She has a normal mood and affect. Her speech is normal and behavior is normal.  Nursing note and vitals reviewed.    ED Treatments /  Results  Labs (all labs ordered are listed, but only abnormal results are displayed) Labs Reviewed  CBC  URINALYSIS, ROUTINE W REFLEX MICROSCOPIC (NOT [facility])  COMPREHENSIVE METABOLIC PANEL  LIPASE, BLOOD  I-STAT BETA HCG BLOOD, ED ([facility], [facility], [facility] ONLY)    EKG  EKG Interpretation None       Radiology No results found.  Procedures Procedures (including critical care time)  Medications Ordered in ED Medications  gi cocktail (Maalox,Lidocaine,Donnatal) (not administered)  ondansetron (ZOFRAN-ODT) disintegrating tablet 8 mg  (not administered)  famotidine (PEPCID) tablet 40 mg (not administered)     Initial Impression / Assessment and Plan / ED Course  I have reviewed the triage vital signs and the nursing notes.  Pertinent labs & imaging results that were available during my care of the patient were reviewed by me and considered in my medical decision making (see chart for details).  Clinical Course    Patient given medications for GERD and feels better. Mild elevated transaminases were noted and patient denies any use of acetaminophen or alcohol. Was told to follow-up on this.   Final Clinical Impressions(s) / ED Diagnoses   Final diagnoses:  None    New Prescriptions New Prescriptions   No medications on file     Lorre NickAnthony Montgomery Rothlisberger, MD 11/29/15 1056

## 2015-11-29 NOTE — ED Notes (Signed)
Pt cannot use restroom at this time, aware urine specimen is needed.  

## 2015-11-29 NOTE — ED Triage Notes (Signed)
Pt c/o diffuse mid abdominal pain, nausea, diarrhea. No emesis. No sick contacts. No recent spoiled food.

## 2015-12-26 ENCOUNTER — Emergency Department (HOSPITAL_COMMUNITY): Payer: No Typology Code available for payment source

## 2015-12-26 ENCOUNTER — Emergency Department (HOSPITAL_COMMUNITY)
Admission: EM | Admit: 2015-12-26 | Discharge: 2015-12-26 | Disposition: A | Discharge status: Home / Self Care | Payer: No Typology Code available for payment source | Attending: Emergency Medicine | Admitting: Emergency Medicine

## 2015-12-26 ENCOUNTER — Encounter (HOSPITAL_COMMUNITY): Payer: Self-pay | Admitting: Emergency Medicine

## 2015-12-26 DIAGNOSIS — Y999 Unspecified external cause status: Secondary | ICD-10-CM | POA: Insufficient documentation

## 2015-12-26 DIAGNOSIS — S3991XA Unspecified injury of abdomen, initial encounter: Secondary | ICD-10-CM | POA: Diagnosis not present

## 2015-12-26 DIAGNOSIS — N949 Unspecified condition associated with female genital organs and menstrual cycle: Secondary | ICD-10-CM

## 2015-12-26 DIAGNOSIS — Y92410 Unspecified street and highway as the place of occurrence of the external cause: Secondary | ICD-10-CM | POA: Diagnosis not present

## 2015-12-26 DIAGNOSIS — N838 Other noninflammatory disorders of ovary, fallopian tube and broad ligament: Secondary | ICD-10-CM | POA: Insufficient documentation

## 2015-12-26 DIAGNOSIS — M25552 Pain in left hip: Secondary | ICD-10-CM | POA: Diagnosis not present

## 2015-12-26 DIAGNOSIS — R0789 Other chest pain: Secondary | ICD-10-CM | POA: Diagnosis not present

## 2015-12-26 DIAGNOSIS — R1030 Lower abdominal pain, unspecified: Secondary | ICD-10-CM

## 2015-12-26 DIAGNOSIS — M25551 Pain in right hip: Secondary | ICD-10-CM | POA: Diagnosis not present

## 2015-12-26 DIAGNOSIS — Y939 Activity, unspecified: Secondary | ICD-10-CM | POA: Diagnosis not present

## 2015-12-26 LAB — POC URINE PREG, ED: PREG TEST UR: NEGATIVE

## 2015-12-26 MED ORDER — IOPAMIDOL (ISOVUE-300) INJECTION 61%
INTRAVENOUS | Status: AC
  Administered 2015-12-26: 100 mL
  Filled 2015-12-26: qty 100

## 2015-12-26 MED ORDER — NAPROXEN 500 MG PO TABS: 500.0000 mg | ORAL_TABLET | Freq: Two times a day (BID) | ORAL | 0 refills | Status: DC

## 2015-12-26 MED ORDER — FENTANYL CITRATE (PF) 100 MCG/2ML IJ SOLN
50.0000 ug | Freq: Once | INTRAMUSCULAR | Status: AC
  Administered 2015-12-26: 50 ug via INTRAVENOUS
  Filled 2015-12-26: qty 2

## 2015-12-26 NOTE — ED Notes (Signed)
Pt ambulatory at discharge. NAD.

## 2015-12-26 NOTE — ED Notes (Signed)
Pt returned to room and placed back on monitor. Pt removed C Collar stating it was hurting her neck. Pt. Asking if she can leave. MD made aware.

## 2015-12-26 NOTE — ED Notes (Signed)
Patient ambulatory to restroom down the hall, accompanied by family member. Pt moves all extremities without difficulty.

## 2015-12-26 NOTE — ED Notes (Signed)
Called CT - pt is next, updated patient regarding this and patient states that she will now stay for the scans. PA-C aware.
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
## 2015-12-26 NOTE — ED Notes (Signed)
Pt returned to room and placed back on monitor.  
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
## 2015-12-26 NOTE — ED Notes (Signed)
ED Provider at bedside. 

## 2015-12-26 NOTE — ED Notes (Signed)
Patient transported to imaging.

## 2015-12-26 NOTE — ED Notes (Signed)
This RN attempted IV X2. Christena DeemBrooke B RN attempting now.

## 2015-12-26 NOTE — Discharge Instructions (Signed)
Your imaging today is normal.  An incidental finding of a 5.5 cm left adnexal cyst was found on your CT.  This can be further evaluated by a pelvic ultrasound.  Please follow up with gynecology for this.  Please take Naprosyn for pain.  This makes you drowsy, so do not drive or operate machinery when taking this medication.  Return to the ED if you experience worsening abdominal pain, dizziness, passing out, chest pain, shortness of breath, or any new or concerning symptoms.

## 2017-06-03 ENCOUNTER — Other Ambulatory Visit: Payer: Self-pay

## 2017-06-03 ENCOUNTER — Encounter (HOSPITAL_BASED_OUTPATIENT_CLINIC_OR_DEPARTMENT_OTHER): Payer: Self-pay | Admitting: *Deleted

## 2017-06-03 ENCOUNTER — Emergency Department (HOSPITAL_BASED_OUTPATIENT_CLINIC_OR_DEPARTMENT_OTHER)
Admission: EM | Admit: 2017-06-03 | Discharge: 2017-06-03 | Disposition: A | Discharge status: Home / Self Care | Payer: Medicaid Other | Attending: Emergency Medicine | Admitting: Emergency Medicine

## 2017-06-03 DIAGNOSIS — S50811A Abrasion of right forearm, initial encounter: Secondary | ICD-10-CM | POA: Diagnosis not present

## 2017-06-03 DIAGNOSIS — Z79899 Other long term (current) drug therapy: Secondary | ICD-10-CM | POA: Insufficient documentation

## 2017-06-03 DIAGNOSIS — Y998 Other external cause status: Secondary | ICD-10-CM | POA: Insufficient documentation

## 2017-06-03 DIAGNOSIS — Y9389 Activity, other specified: Secondary | ICD-10-CM | POA: Insufficient documentation

## 2017-06-03 DIAGNOSIS — Y92410 Unspecified street and highway as the place of occurrence of the external cause: Secondary | ICD-10-CM | POA: Insufficient documentation

## 2017-06-03 DIAGNOSIS — S59911A Unspecified injury of right forearm, initial encounter: Secondary | ICD-10-CM | POA: Diagnosis present

## 2017-06-03 LAB — RAPID URINE DRUG SCREEN, HOSP PERFORMED
Amphetamines: NOT DETECTED
BARBITURATES: NOT DETECTED
BENZODIAZEPINES: NOT DETECTED
COCAINE: NOT DETECTED
Opiates: NOT DETECTED
TETRAHYDROCANNABINOL: POSITIVE — AB

## 2017-06-03 LAB — ETHANOL: Alcohol, Ethyl (B): <10 mg/dL (ref <10)

## 2017-06-03 MED ORDER — NAPROXEN 500 MG PO TABS: 500.0000 mg | ORAL_TABLET | Freq: Two times a day (BID) | ORAL | 0 refills | Status: DC
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
## 2017-06-03 NOTE — ED Notes (Signed)
Patient's wound cleaned with wound cleanser spray. Bacitracin ointment applied and nonadherent guaze with kling wrap.
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
## 2017-06-03 NOTE — Discharge Instructions (Signed)
Take naproxen 2 times a day with meals.  Do not take other anti-inflammatories at the same time open (Advil, Motrin, ibuprofen, Aleve). You may supplement with Tylenol if you need further pain control. Use ice packs or heating pads if this helps control your pain. You will likely have continued muscle stiffness and soreness over the next several days.  Follow-up with your primary care doctor in 1 week if your symptoms are not improving. The results for your test will be posted on my chart.  It will likely take several days for all of the results to be posted. Return to the emergency room if you develop vision changes, slurred speech, severe headaches, vomiting, numbness, or any new or concerning symptoms.

## 2017-06-12 LAB — GHB SCREEN, S/P

## 2017-08-31 ENCOUNTER — Encounter (HOSPITAL_COMMUNITY): Payer: Self-pay | Admitting: Emergency Medicine

## 2017-08-31 ENCOUNTER — Emergency Department (HOSPITAL_COMMUNITY)
Admission: EM | Admit: 2017-08-31 | Discharge: 2017-09-01 | Disposition: A | Discharge status: Home / Self Care | Payer: Medicaid Other | Attending: Emergency Medicine | Admitting: Emergency Medicine

## 2017-08-31 ENCOUNTER — Other Ambulatory Visit: Payer: Self-pay

## 2017-08-31 DIAGNOSIS — Y9389 Activity, other specified: Secondary | ICD-10-CM | POA: Insufficient documentation

## 2017-08-31 DIAGNOSIS — Y92511 Restaurant or cafe as the place of occurrence of the external cause: Secondary | ICD-10-CM | POA: Insufficient documentation

## 2017-08-31 DIAGNOSIS — S61432A Puncture wound without foreign body of left hand, initial encounter: Secondary | ICD-10-CM | POA: Insufficient documentation

## 2017-08-31 DIAGNOSIS — Y990 Civilian activity done for income or pay: Secondary | ICD-10-CM | POA: Insufficient documentation

## 2017-08-31 DIAGNOSIS — Z23 Encounter for immunization: Secondary | ICD-10-CM | POA: Diagnosis not present

## 2017-08-31 DIAGNOSIS — W268XXA Contact with other sharp object(s), not elsewhere classified, initial encounter: Secondary | ICD-10-CM | POA: Insufficient documentation

## 2017-08-31 NOTE — ED Notes (Signed)
Bed: WA02 Expected date:  Expected time:  Means of arrival:  Comments: 
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
## 2017-09-01 MED ORDER — CEPHALEXIN 500 MG PO CAPS: 500.0000 mg | ORAL_CAPSULE | Freq: Four times a day (QID) | ORAL | 0 refills | Status: DC

## 2017-09-01 MED ORDER — CEPHALEXIN 500 MG PO CAPS
500.0000 mg | ORAL_CAPSULE | Freq: Once | ORAL | Status: AC
  Administered 2017-09-01: 500 mg via ORAL
  Filled 2017-09-01: qty 1

## 2017-09-01 MED ORDER — IBUPROFEN 800 MG PO TABS
800.0000 mg | ORAL_TABLET | Freq: Once | ORAL | Status: AC
  Administered 2017-09-01: 800 mg via ORAL
  Filled 2017-09-01: qty 1

## 2017-09-01 MED ORDER — IBUPROFEN 600 MG PO TABS: 600.0000 mg | ORAL_TABLET | Freq: Four times a day (QID) | ORAL | 0 refills | Status: DC | PRN

## 2017-09-01 MED ORDER — TETANUS-DIPHTH-ACELL PERTUSSIS 5-2.5-18.5 LF-MCG/0.5 IM SUSP
0.5000 mL | Freq: Once | INTRAMUSCULAR | Status: AC
  Administered 2017-09-01: 0.5 mL via INTRAMUSCULAR
  Filled 2017-09-01: qty 0.5

## 2017-09-01 NOTE — Discharge Instructions (Signed)
Take Keflex as prescribed until finished.  You may use ibuprofen for pain.  Ice areas of pain and inflammation to limit swelling.  Do this 3-4 times per day.  Return to the ED if symptoms worsen or signs of infection develop.

## 2017-09-01 NOTE — ED Provider Notes (Signed)
[facility]-EMERGENCY DEPT Provider Note   CSN: [id number] Arrival date & time: 08/31/17  2317     History   Chief Complaint Chief Complaint  Patient presents with  . Hand Injury    HPI Dorothy Sexton is a 22 y.o. female.   22 year old female with no significant past medical history presents to the emergency department for evaluation of a puncture wound to her left hand.  She denies wearing any gloves at the time of injury.  States that she punctured her hand on an bar check spindle while at work [employer].  She has had some soreness.  Did apply ice with some relief.  No medications taken PTA.  No drainage from the wound or associated fevers.  Tdap unknown.     History reviewed. No pertinent past medical history.  There are no active problems to display for this patient.   History reviewed. No pertinent surgical history.   OB History   None      Home Medications    Prior to Admission medications   Medication Sig Start Date End Date Taking? Authorizing Provider  cephALEXin (KEFLEX) 500 MG capsule Take 1 capsule (500 mg total) by mouth 4 (four) times daily. 09/01/17   Antony MaduraHumes, Elford Evilsizer, PA-C  etonogestrel (NEXPLANON) 68 MG IMPL implant 1 each by Subdermal route once. Placed in 05/2013    [provider]  ibuprofen (ADVIL,MOTRIN) 600 MG tablet Take 1 tablet (600 mg total) by mouth every 6 (six) hours as needed. 09/01/17   Antony MaduraHumes, Aalijah Lanphere, PA-C  naproxen (NAPROSYN) 500 MG tablet Take 1 tablet (500 mg total) by mouth 2 (two) times daily with a meal. 06/03/17   Caccavale, Sophia, PA-C  naproxen sodium (ANAPROX) 220 MG tablet Take 440 mg by mouth every 12 (twelve) hours as needed (for pain).    [provider]  ondansetron (ZOFRAN) 8 MG tablet Take 8 mg by mouth 2 (two) times daily as needed for nausea or vomiting.  11/29/15   [provider]    Family History Family History  Problem Relation Age of Onset  . Stroke Other   . Diabetes  Other   . Hypertension Other   . Diabetes Mother     Social History Social History   Tobacco Use  . Smoking status: Never Smoker  . Smokeless tobacco: Never Used  Substance Use Topics  . Alcohol use: No    Comment: occ  . Drug use: No     Allergies   Patient has no known allergies.   Review of Systems Review of Systems Ten systems reviewed and are negative for acute change, except as noted in the HPI.    Physical Exam Updated Vital Signs BP 109/67   Pulse 86   Temp 98.7 F (37.1 C) (Oral)   Resp 14   SpO2 100%   Physical Exam  Constitutional: She is oriented to person, place, and time. She appears well-developed and well-nourished. No distress.  Nontoxic appearing in no acute distress  HENT:  Head: Normocephalic and atraumatic.  Eyes: Conjunctivae and EOM are normal. No scleral icterus.  Neck: Normal range of motion.  Cardiovascular: Normal rate, regular rhythm and intact distal pulses.  Distal radial pulse 2+ in the left upper extremity.  Capillary refill brisk in all digits of the left hand  Pulmonary/Chest: Effort normal. No respiratory distress.  Respirations even and unlabored  Musculoskeletal: Normal range of motion.       Left hand: She exhibits tenderness. She exhibits normal  range of motion, no bony tenderness, normal capillary refill and no deformity. Normal sensation noted. Normal strength noted.       Hands: Neurological: She is alert and oriented to person, place, and time. She exhibits normal muscle tone. Coordination normal.  Sensation to light touch intact.  Patient able to wiggle all fingers.  Finger to thumb opposition intact in the left hand.  Skin: Skin is warm and dry. No rash noted. She is not diaphoretic. No erythema. No pallor.  Psychiatric: She has a normal mood and affect. Her behavior is normal.  Nursing note and vitals reviewed.    ED Treatments / Results  Labs (all labs ordered are listed, but only abnormal results are  displayed) Labs Reviewed - No data to display  EKG None  Radiology No results found.  Procedures Procedures (including critical care time)  Medications Ordered in ED Medications  cephALEXin (KEFLEX) capsule 500 mg (has no administration in time range)  ibuprofen (ADVIL,MOTRIN) tablet 800 mg (has no administration in time range)  Tdap (BOOSTRIX) injection 0.5 mL (has no administration in time range)     Initial Impression / Assessment and Plan / ED Course  I have reviewed the triage vital signs and the nursing notes.  Pertinent labs & imaging results that were available during my care of the patient were reviewed by me and considered in my medical decision making (see chart for details).     22 year old female presents with puncture wound to her left hand which occurred at work this evening.  Tetanus updated in the emergency department.  Patient neurovascularly intact.  Range of motion preserved.  No present concern for secondary infection or cellulitis.  Will continue with supportive management.  Return precautions discussed and provided. Patient discharged in stable condition with no unaddressed concerns.   Final Clinical Impressions(s) / ED Diagnoses   Final diagnoses:  Puncture wound of left hand without foreign body, initial encounter    ED Discharge Orders         Ordered    cephALEXin (KEFLEX) 500 MG capsule  4 times daily     09/01/17 0021    ibuprofen (ADVIL,MOTRIN) 600 MG tablet  Every 6 hours PRN     09/01/17 0021           Antony MaduraHumes, Orie Baxendale, PA-C 09/01/17 0030    Palumbo, April, MD 09/01/17 0107

## 2017-10-03 ENCOUNTER — Other Ambulatory Visit: Payer: Self-pay

## 2017-10-03 ENCOUNTER — Encounter (HOSPITAL_COMMUNITY): Payer: Self-pay | Admitting: *Deleted

## 2017-10-03 DIAGNOSIS — Z79899 Other long term (current) drug therapy: Secondary | ICD-10-CM | POA: Diagnosis not present

## 2017-10-03 DIAGNOSIS — N760 Acute vaginitis: Secondary | ICD-10-CM | POA: Insufficient documentation

## 2017-10-03 DIAGNOSIS — R102 Pelvic and perineal pain: Secondary | ICD-10-CM | POA: Diagnosis present

## 2017-10-03 LAB — PREGNANCY, URINE: PREG TEST UR: NEGATIVE

## 2017-10-03 LAB — URINALYSIS, ROUTINE W REFLEX MICROSCOPIC
BILIRUBIN URINE: NEGATIVE
Glucose, UA: NEGATIVE mg/dL
HGB URINE DIPSTICK: NEGATIVE
KETONES UR: NEGATIVE mg/dL
Leukocytes, UA: NEGATIVE
Nitrite: NEGATIVE
PROTEIN: NEGATIVE mg/dL
Specific Gravity, Urine: 1.025 (ref 1.005–1.030)
pH: 6 (ref 5.0–8.0)

## 2017-10-03 LAB — CBC
HCT: 38 % (ref 36.0–46.0)
Hemoglobin: 12.5 g/dL (ref 12.0–15.0)
MCH: 30.9 pg (ref 26.0–34.0)
MCHC: 32.9 g/dL (ref 30.0–36.0)
MCV: 93.8 fL (ref 78.0–100.0)
PLATELETS: 288 10*3/uL (ref 150–400)
RBC: 4.05 MIL/uL (ref 3.87–5.11)
RDW: 12.8 % (ref 11.5–15.5)
WBC: 5.9 10*3/uL (ref 4.0–10.5)

## 2017-10-03 NOTE — ED Triage Notes (Signed)
Pt reports lower abdominal for "2-3 weeks". Tonight, she feels a burning sensation in her abdomen. No urinary problems or vaginal symptoms, but she wants to "get checked for everything while I am here, like STD's". No n/v/d or fevers.

## 2017-10-04 ENCOUNTER — Emergency Department (HOSPITAL_COMMUNITY): Payer: Medicaid Other

## 2017-10-04 ENCOUNTER — Emergency Department (HOSPITAL_COMMUNITY)
Admission: EM | Admit: 2017-10-04 | Discharge: 2017-10-04 | Disposition: A | Discharge status: Home / Self Care | Payer: Medicaid Other | Attending: Emergency Medicine | Admitting: Emergency Medicine

## 2017-10-04 DIAGNOSIS — N760 Acute vaginitis: Secondary | ICD-10-CM

## 2017-10-04 DIAGNOSIS — R102 Pelvic and perineal pain: Secondary | ICD-10-CM

## 2017-10-04 DIAGNOSIS — B9689 Other specified bacterial agents as the cause of diseases classified elsewhere: Secondary | ICD-10-CM

## 2017-10-04 LAB — GC/CHLAMYDIA PROBE AMP (~~LOC~~) NOT AT ARMC
Chlamydia: NEGATIVE
Neisseria Gonorrhea: NEGATIVE

## 2017-10-04 LAB — COMPREHENSIVE METABOLIC PANEL
ALT: 15 U/L (ref 0–44)
AST: 16 U/L (ref 15–41)
Albumin: 4.4 g/dL (ref 3.5–5.0)
Alkaline Phosphatase: 42 U/L (ref 38–126)
Anion gap: 8 (ref 5–15)
BUN: 15 mg/dL (ref 6–20)
CO2: 25 mmol/L (ref 22–32)
Calcium: 9.3 mg/dL (ref 8.9–10.3)
Chloride: 105 mmol/L (ref 98–111)
Creatinine, Ser: 0.73 mg/dL (ref 0.44–1.00)
GFR calc Af Amer: >60 mL/min (ref >60)
GFR calc non Af Amer: >60 mL/min (ref >60)
Glucose, Bld: 92 mg/dL (ref 70–99)
Potassium: 3.4 mmol/L — ABNORMAL LOW (ref 3.5–5.1)
Sodium: 138 mmol/L (ref 135–145)
Total Bilirubin: 0.6 mg/dL (ref 0.3–1.2)
Total Protein: 7.9 g/dL (ref 6.5–8.1)

## 2017-10-04 LAB — HIV ANTIBODY (ROUTINE TESTING W REFLEX): HIV SCREEN 4TH GENERATION: NONREACTIVE

## 2017-10-04 LAB — LIPASE, BLOOD: Lipase: 35 U/L (ref 11–51)

## 2017-10-04 LAB — WET PREP, GENITAL
SPERM: NONE SEEN
Trich, Wet Prep: NONE SEEN
Yeast Wet Prep HPF POC: NONE SEEN

## 2017-10-04 MED ORDER — METRONIDAZOLE 500 MG PO TABS: 500.0000 mg | ORAL_TABLET | Freq: Two times a day (BID) | ORAL | 0 refills | Status: DC

## 2017-10-04 NOTE — ED Provider Notes (Signed)
[facility]-EMERGENCY DEPT Provider Note   CSN: [id number] Arrival date & time: 10/03/17  2258     History   Chief Complaint Chief Complaint  Patient presents with  . Abdominal Pain    HPI Dorothy Sexton is a 22 y.o. female.  Patient presents to the emergency department for evaluation of abdominal pain.  She reports that she has been having low abdominal and pelvic discomfort for 2 or 3 weeks.  Symptoms wax and wane but did not go completely away.  She has not had associated nausea, vomiting, diarrhea, constipation.  She denies urinary symptoms.  She has not had any unusual vaginal bleeding, vaginal discharge or irritation.  She would, however, like to be checked for STDs.     History reviewed. No pertinent past medical history.  There are no active problems to display for this patient.   History reviewed. No pertinent surgical history.   OB History   None      Home Medications    Prior to Admission medications   Medication Sig Start Date End Date Taking? Authorizing Provider  etonogestrel (NEXPLANON) 68 MG IMPL implant 1 each by Subdermal route once. Placed in 05/2013   Yes [provider]  metroNIDAZOLE (FLAGYL) 500 MG tablet Take 1 tablet (500 mg total) by mouth 2 (two) times daily. One po bid x 7 days 10/04/17   Gilda CreasePollina, Suellen Durocher J, MD    Family History Family History  Problem Relation Age of Onset  . Stroke Other   . Diabetes Other   . Hypertension Other   . Diabetes Mother     Social History Social History   Tobacco Use  . Smoking status: Never Smoker  . Smokeless tobacco: Never Used  Substance Use Topics  . Alcohol use: No    Comment: occ  . Drug use: No     Allergies   Patient has no known allergies.   Review of Systems Review of Systems  Genitourinary: Positive for pelvic pain.  All other systems reviewed and are negative.    Physical Exam Updated Vital Signs BP 116/74   Pulse 79   Temp 98.7 F  (37.1 C)   Resp 16   LMP 08/31/2017   SpO2 100%   Physical Exam  Constitutional: She is oriented to person, place, and time. She appears well-developed and well-nourished. No distress.  HENT:  Head: Normocephalic and atraumatic.  Right Ear: Hearing normal.  Left Ear: Hearing normal.  Nose: Nose normal.  Mouth/Throat: Oropharynx is clear and moist and mucous membranes are normal.  Eyes: Pupils are equal, round, and reactive to light. Conjunctivae and EOM are normal.  Neck: Normal range of motion. Neck supple.  Cardiovascular: Regular rhythm, S1 normal and S2 normal. Exam reveals no gallop and no friction rub.  No murmur heard. Pulmonary/Chest: Effort normal and breath sounds normal. No respiratory distress. She exhibits no tenderness.  Abdominal: Soft. Normal appearance and bowel sounds are normal. There is no hepatosplenomegaly. There is no tenderness. There is no rebound, no guarding, no tenderness at McBurney's point and negative Murphy's sign. No hernia. Hernia confirmed negative in the right inguinal area and confirmed negative in the left inguinal area.  Genitourinary: Vagina normal and uterus normal. There is no rash or tenderness on the right labia. There is no rash or tenderness on the left labia. Cervix exhibits friability. Cervix exhibits no motion tenderness and no discharge. Right adnexum displays tenderness and fullness. Left adnexum displays no mass, no tenderness and  no fullness. No vaginal discharge found.  Musculoskeletal: Normal range of motion.  Lymphadenopathy: No inguinal adenopathy noted on the right or left side.  Neurological: She is alert and oriented to person, place, and time. She has normal strength. No cranial nerve deficit or sensory deficit. Coordination normal. GCS eye subscore is 4. GCS verbal subscore is 5. GCS motor subscore is 6.  Skin: Skin is warm, dry and intact. No rash noted. No cyanosis.  Psychiatric: She has a normal mood and affect. Her speech is  normal and behavior is normal. Thought content normal.  Nursing note and vitals reviewed.    ED Treatments / Results  Labs (all labs ordered are listed, but only abnormal results are displayed) Labs Reviewed  WET PREP, GENITAL - Abnormal; Notable for the following components:      Result Value   Clue Cells Wet Prep HPF POC PRESENT (*)    WBC, Wet Prep HPF POC FEW (*)    All other components within normal limits  COMPREHENSIVE METABOLIC PANEL - Abnormal; Notable for the following components:   Potassium 3.4 (*)    All other components within normal limits  LIPASE, BLOOD  CBC  URINALYSIS, ROUTINE W REFLEX MICROSCOPIC  PREGNANCY, URINE  HIV ANTIBODY (ROUTINE TESTING W REFLEX)  GC/CHLAMYDIA PROBE AMP ([place]) NOT AT [facility]    EKG None  Radiology US Pelvic Doppler (torsion R/o Or Mass Arterial Flow)  Result Date: 10/04/2017 CLINICAL DATA:  Pelvic pain for 3 weeks. EXAM: TRANSABDOMINAL AND TRANSVAGINAL ULTRASOUND OF PELVIS DOPPLER ULTRASOUND OF OVARIES TECHNIQUE: Both transabdominal and transvaginal ultrasound examinations of the pelvis were performed. Transabdominal technique was performed for global imaging of the pelvis including uterus, ovaries, adnexal regions, and pelvic cul-de-sac. It was necessary to proceed with endovaginal exam following the transabdominal exam to visualize the ovaries and endometrium. Color and duplex Doppler ultrasound was utilized to evaluate blood flow to the ovaries. COMPARISON:  None. FINDINGS: Uterus Measurements: 6.6 x 3.3 x 3.9 cm. Uterus is anteverted. No fibroids or other mass visualized. Nabothian cysts in the cervix. Endometrium Thickness: 2 mm. Minimal fluid in the endocervical region. Otherwise normal appearance. Right ovary Measurements: 3.4 x 1.7 x 1.8 cm. Normal appearance/no adnexal mass. Left ovary Measurements: 2.8 x 1.5 x 2.5 cm. Normal appearance/no adnexal mass. Pulsed Doppler evaluation of both ovaries demonstrates normal low-resistance  arterial and venous waveforms. Other findings No abnormal free fluid. IMPRESSION: Normal ultrasound appearance of the uterus and ovaries. No evidence of ovarian mass or torsion. Small amount of fluid in the endocervical canal may be physiologic. Electronically Signed   By: Burman Nieves M.D.   On: 10/04/2017 02:28   US Pelvic Complete With Transvaginal  Result Date: 10/04/2017 CLINICAL DATA:  Pelvic pain for 3 weeks. EXAM: TRANSABDOMINAL AND TRANSVAGINAL ULTRASOUND OF PELVIS DOPPLER ULTRASOUND OF OVARIES TECHNIQUE: Both transabdominal and transvaginal ultrasound examinations of the pelvis were performed. Transabdominal technique was performed for global imaging of the pelvis including uterus, ovaries, adnexal regions, and pelvic cul-de-sac. It was necessary to proceed with endovaginal exam following the transabdominal exam to visualize the ovaries and endometrium. Color and duplex Doppler ultrasound was utilized to evaluate blood flow to the ovaries. COMPARISON:  None. FINDINGS: Uterus Measurements: 6.6 x 3.3 x 3.9 cm. Uterus is anteverted. No fibroids or other mass visualized. Nabothian cysts in the cervix. Endometrium Thickness: 2 mm. Minimal fluid in the endocervical region. Otherwise normal appearance. Right ovary Measurements: 3.4 x 1.7 x 1.8 cm. Normal appearance/no adnexal mass. Left  ovary Measurements: 2.8 x 1.5 x 2.5 cm. Normal appearance/no adnexal mass. Pulsed Doppler evaluation of both ovaries demonstrates normal low-resistance arterial and venous waveforms. Other findings No abnormal free fluid. IMPRESSION: Normal ultrasound appearance of the uterus and ovaries. No evidence of ovarian mass or torsion. Small amount of fluid in the endocervical canal may be physiologic. Electronically Signed   By: Burman Nieves M.D.   On: 10/04/2017 02:28    Procedures Procedures (including critical care time)  Medications Ordered in ED Medications - No data to display   Initial Impression / Assessment  and Plan / ED Course  I have reviewed the triage vital signs and the nursing notes.  Pertinent labs & imaging results that were available during my care of the patient were reviewed by me and considered in my medical decision making (see chart for details).     Patient presents to the emergency department for evaluation of pelvic pain.  Symptoms have been does not for 2 or 3 weeks.  She does not have any associated symptoms.  Vital signs are normal.  She does not have any abdominal pain or tenderness.  Pelvic exam revealed tenderness and fullness in the right adnexal region.  There was no cervical motion tenderness.  Ultrasound was performed.  No evidence of torsion, no cyst or other abnormality.  All of her lab work is normal.  Treat with NSAIDs, follow-up with OB/GYN and PCP.  Final Clinical Impressions(s) / ED Diagnoses   Final diagnoses:  Pelvic pain  BV (bacterial vaginosis)    ED Discharge Orders         Ordered    metroNIDAZOLE (FLAGYL) 500 MG tablet  2 times daily     10/04/17 0303           Gilda Crease, MD 10/04/17 [id number]

## 2017-10-04 NOTE — ED Notes (Signed)
US at bedside

## 2017-10-04 NOTE — ED Notes (Signed)
ED Provider at bedside. 

## 2018-08-05 ENCOUNTER — Other Ambulatory Visit: Payer: Self-pay

## 2018-08-05 ENCOUNTER — Encounter (HOSPITAL_COMMUNITY): Payer: Self-pay

## 2018-08-05 ENCOUNTER — Emergency Department (HOSPITAL_COMMUNITY)
Admission: EM | Admit: 2018-08-05 | Discharge: 2018-08-06 | Disposition: A | Discharge status: Home / Self Care | Payer: Medicaid Other | Attending: Emergency Medicine | Admitting: Emergency Medicine

## 2018-08-05 DIAGNOSIS — R1084 Generalized abdominal pain: Secondary | ICD-10-CM | POA: Diagnosis present

## 2018-08-05 DIAGNOSIS — R8271 Bacteriuria: Secondary | ICD-10-CM

## 2018-08-05 LAB — URINALYSIS, ROUTINE W REFLEX MICROSCOPIC
Bilirubin Urine: NEGATIVE
Glucose, UA: NEGATIVE mg/dL
Hgb urine dipstick: NEGATIVE
Ketones, ur: NEGATIVE mg/dL
Nitrite: NEGATIVE
Protein, ur: NEGATIVE mg/dL
Specific Gravity, Urine: 1.029 (ref 1.005–1.030)
Squamous Epithelial / LPF: >50 — ABNORMAL HIGH (ref 0–5)
pH: 6 (ref 5.0–8.0)

## 2018-08-05 NOTE — ED Triage Notes (Signed)
Pt reports sharp L lower abdominal pain that started about 1 hour ago. She denies vaginal bleeding. Pt is 4 months pregnant. 1st pregnancy.

## 2018-08-05 NOTE — ED Notes (Signed)
Father of baby [phone]   Would like updates

## 2018-08-05 NOTE — ED Notes (Signed)
Pt given a urine cup and instructed to provide a specimen when ready. °

## 2018-08-05 NOTE — ED Notes (Signed)
Urine culture sent to the lab. 

## 2018-08-06 MED ORDER — ACETAMINOPHEN 325 MG PO TABS
650.0000 mg | ORAL_TABLET | Freq: Once | ORAL | Status: AC
  Administered 2018-08-06: 650 mg via ORAL
  Filled 2018-08-06: qty 2

## 2018-08-06 MED ORDER — CEPHALEXIN 500 MG PO CAPS: 500.0000 mg | ORAL_CAPSULE | Freq: Two times a day (BID) | ORAL | 0 refills | Status: DC

## 2018-08-06 NOTE — ED Notes (Signed)
Pt ambulated to bathroom with steady gait. 

## 2018-08-06 NOTE — ED Provider Notes (Signed)
[facility]-EMERGENCY DEPT Provider Note   CSN: [id number] Arrival date & time: 08/05/18  2249     History   Chief Complaint Chief Complaint  Patient presents with  . Abdominal Pain    HPI Dorothy Sexton is a 23 y.o. female.     The history is provided by the patient.  Abdominal Pain Pain location:  Generalized Pain quality: sharp   Pain radiates to:  Does not radiate Pain severity:  Moderate Onset quality:  Sudden Duration:  2 hours Timing:  Constant Progression:  Improving Chronicity:  New Relieved by:  None tried Worsened by:  Nothing Associated symptoms: no chest pain, no constipation, no cough, no diarrhea, no dysuria, no fever, no vaginal bleeding, no vaginal discharge and no vomiting    Patient is a G1P0 at approximately 17 weeks present with abdominal pain. She has been well until this pain started She has fever/vomiting.  No vaginal bleeding or discharge.  No dysuria. She has never had pain before No OB complications thus far.  PMH- none OB History   No obstetric history on file.      Home Medications    Prior to Admission medications   Medication Sig Start Date End Date Taking? Authorizing Provider  etonogestrel (NEXPLANON) 68 MG IMPL implant 1 each by Subdermal route once. Placed in 05/2013    [provider]    Family History Family History  Problem Relation Age of Onset  . Stroke Other   . Diabetes Other   . Hypertension Other   . Diabetes Mother     Social History Social History   Tobacco Use  . Smoking status: Never Smoker  . Smokeless tobacco: Never Used  Substance Use Topics  . Alcohol use: No    Comment: occ  . Drug use: No     Allergies   Patient has no known allergies.   Review of Systems Review of Systems  Constitutional: Negative for fever.  Respiratory: Negative for cough.   Cardiovascular: Negative for chest pain.  Gastrointestinal: Positive for abdominal pain. Negative for  constipation, diarrhea and vomiting.  Genitourinary: Negative for dysuria, vaginal bleeding and vaginal discharge.  All other systems reviewed and are negative.    Physical Exam Updated Vital Signs BP 106/64   Pulse 86   Temp 98.6 F (37 C) (Oral)   Resp 16   SpO2 100%   Physical Exam CONSTITUTIONAL: Well developed/well nourished HEAD: Normocephalic/atraumatic EYES: EOMI/PERRL ENMT: Mucous membranes moist NECK: supple no meningeal signs SPINE/BACK:entire spine nontender CV: S1/S2 noted, no murmurs/rubs/gallops noted LUNGS: Lungs are clear to auscultation bilaterally, no apparent distress ABDOMEN: soft, nontender, no rebound or guarding, bowel sounds noted throughout abdomen, gravid GU:no cva tenderness NEURO: Pt is awake/alert/appropriate, moves all extremitiesx4.  No facial droop.   EXTREMITIES: pulses normal/equal, full ROM SKIN: warm, color normal PSYCH: no abnormalities of mood noted, alert and oriented to situation   ED Treatments / Results  Labs (all labs ordered are listed, but only abnormal results are displayed) Labs Reviewed  URINALYSIS, ROUTINE W REFLEX MICROSCOPIC - Abnormal; Notable for the following components:      Result Value   APPearance CLOUDY (*)    Leukocytes,Ua SMALL (*)    Bacteria, UA FEW (*)    Squamous Epithelial / LPF >50 (*)    All other components within normal limits  URINE CULTURE    EKG None  Radiology No results found.  Procedures Procedures   Medications Ordered in ED Medications  acetaminophen (TYLENOL) tablet 650 mg (650 mg Oral Given 08/06/18 0020)     Initial Impression / Assessment and Plan / ED Course  I have reviewed the triage vital signs and the nursing notes.  Pertinent labs  results that were available during my care of the patient were reviewed by me and considered in my medical decision making (see chart for details).        12:46 AM Patient resting comfortably.  There is no reproducible pain at this  time.  Will give Tylenol and reassess.  Does have asymptomatic bacteria, will send culture and treat  1:26 AM Patient improved, resting comfortably, watching television. She reports all of her pain is gone at this time. We will discharge home.  We discussed strict ER return precautions.  If she has any return of pain in the next 24 hrs, fever, vomiting, bleeding, discharge she must report to the ER immediately Final Clinical Impressions(s) / ED Diagnoses   Final diagnoses:  Generalized abdominal pain  Asymptomatic bacteriuria    ED Discharge Orders         Ordered    cephALEXin (KEFLEX) 500 MG capsule  2 times daily     08/06/18 0122           Ripley Fraise, MD 08/06/18 0128

## 2018-08-08 LAB — URINE CULTURE

## 2019-05-31 ENCOUNTER — Other Ambulatory Visit: Payer: Self-pay

## 2019-05-31 ENCOUNTER — Emergency Department (HOSPITAL_COMMUNITY)
Admission: EM | Admit: 2019-05-31 | Discharge: 2019-06-01 | Discharge status: Against Medical Advice | Payer: Medicaid Other

## 2019-05-31 NOTE — ED Notes (Signed)
NO answer PT currently not in lobby

## 2019-05-31 NOTE — ED Notes (Signed)
Pt did not answer when called for triage 

## 2019-05-31 NOTE — ED Notes (Signed)
No answer x3

## 2019-06-01 ENCOUNTER — Emergency Department (HOSPITAL_BASED_OUTPATIENT_CLINIC_OR_DEPARTMENT_OTHER)
Admission: EM | Admit: 2019-06-01 | Discharge: 2019-06-01 | Disposition: A | Discharge status: Home / Self Care | Payer: Medicaid Other | Attending: Emergency Medicine | Admitting: Emergency Medicine

## 2019-06-01 ENCOUNTER — Other Ambulatory Visit: Payer: Self-pay

## 2019-06-01 ENCOUNTER — Encounter (HOSPITAL_BASED_OUTPATIENT_CLINIC_OR_DEPARTMENT_OTHER): Payer: Self-pay | Admitting: *Deleted

## 2019-06-01 DIAGNOSIS — Y999 Unspecified external cause status: Secondary | ICD-10-CM | POA: Diagnosis not present

## 2019-06-01 DIAGNOSIS — Y9289 Other specified places as the place of occurrence of the external cause: Secondary | ICD-10-CM | POA: Diagnosis not present

## 2019-06-01 DIAGNOSIS — X12XXXA Contact with other hot fluids, initial encounter: Secondary | ICD-10-CM | POA: Diagnosis not present

## 2019-06-01 DIAGNOSIS — T24211A Burn of second degree of right thigh, initial encounter: Secondary | ICD-10-CM | POA: Diagnosis not present

## 2019-06-01 DIAGNOSIS — Z79899 Other long term (current) drug therapy: Secondary | ICD-10-CM | POA: Insufficient documentation

## 2019-06-01 DIAGNOSIS — Y9389 Activity, other specified: Secondary | ICD-10-CM | POA: Diagnosis not present

## 2019-06-01 MED ORDER — HYDROCODONE-ACETAMINOPHEN 5-325 MG PO TABS: 1.0000 | ORAL_TABLET | ORAL | 0 refills | Status: AC | PRN

## 2019-06-01 MED ORDER — BACITRACIN ZINC 500 UNIT/GM EX OINT
TOPICAL_OINTMENT | Freq: Two times a day (BID) | CUTANEOUS | Status: DC
  Administered 2019-06-01: 1 via TOPICAL

## 2019-06-01 MED ORDER — SILVER SULFADIAZINE 1 % EX CREA: TOPICAL_CREAM | Freq: Once | CUTANEOUS | Status: DC

## 2019-06-01 NOTE — Discharge Instructions (Signed)
Return if any problems.
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
## 2020-03-21 ENCOUNTER — Institutional Professional Consult (permissible substitution): Payer: Medicaid Other | Admitting: Plastic Surgery

## 2020-07-09 IMAGING — US US PELVIS COMPLETE TRANSABD/TRANSVAG
1 series · 13 of 25 positions shown · non-contrast
Comparison: None.

CLINICAL DATA: Pelvic pain for 3 weeks.



[Series 1: us pelvis complete transabd/transvag · 0.17mm/px · 85 acquisitions, 13 frames shown]
[im 1/85]
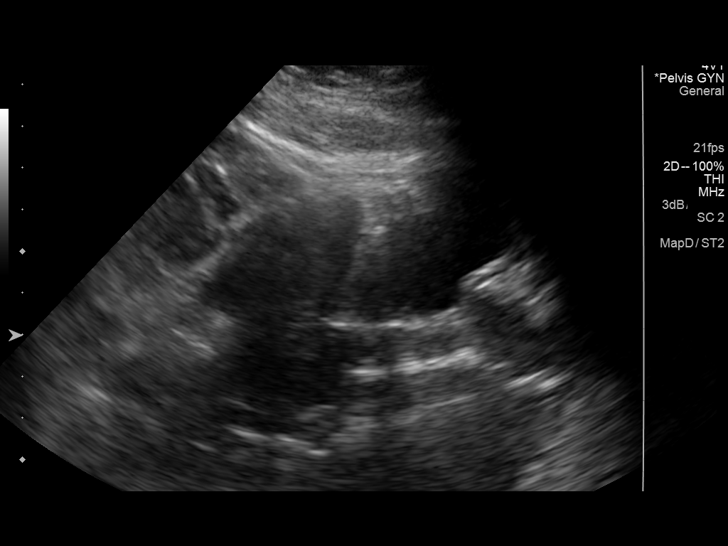
[im 8/85]
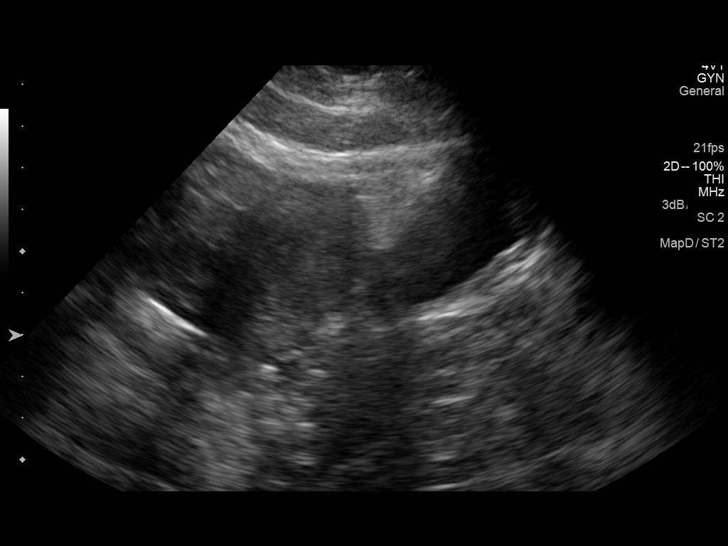
[im 15/85]
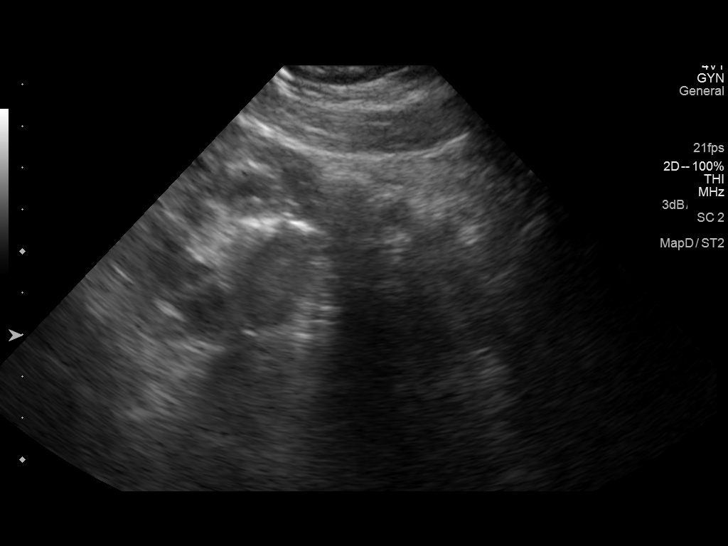
[im 22/85]
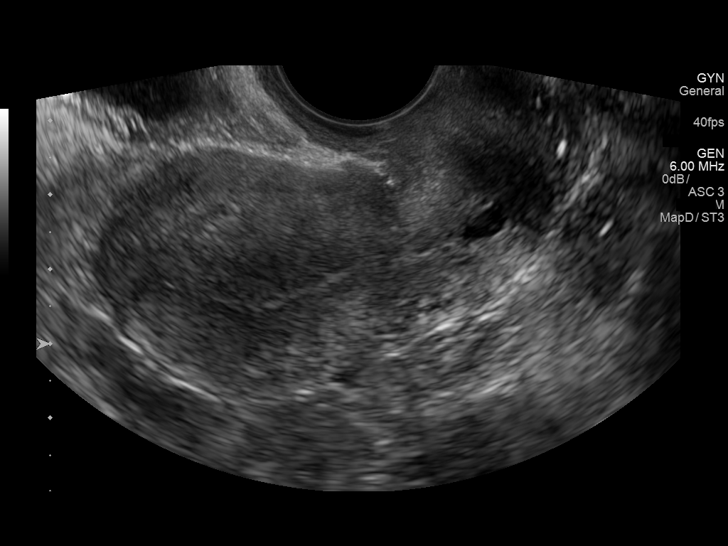
[im 29/85]
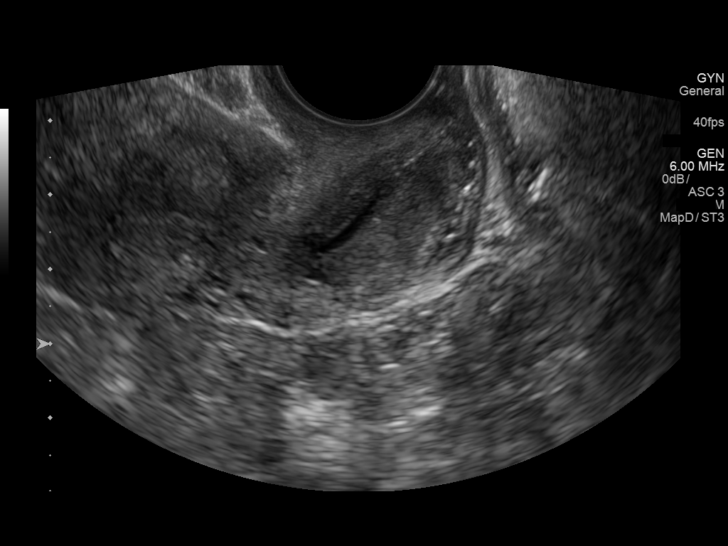
[im 36/85]
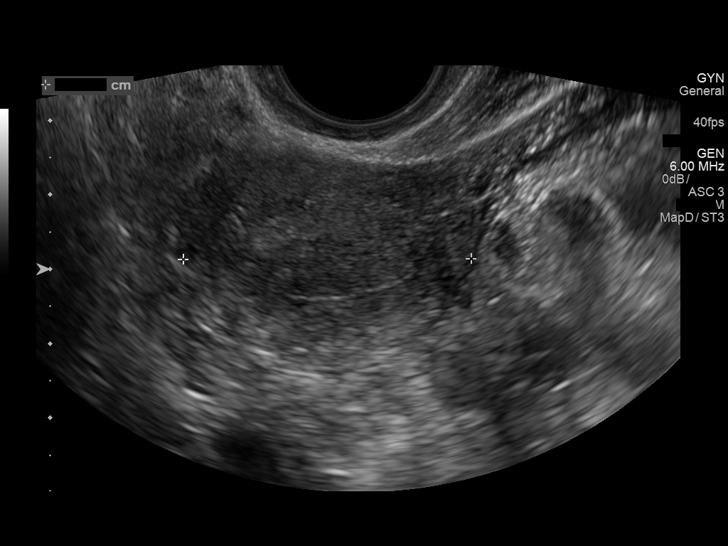
[im 43/85]
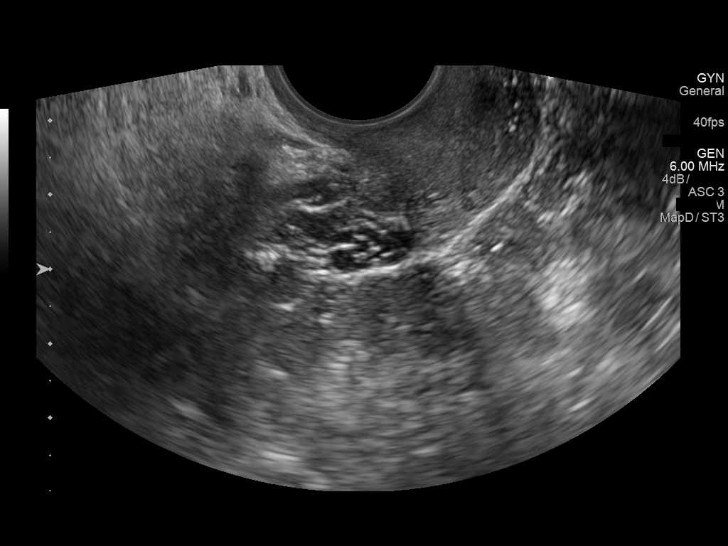
[im 50/85]
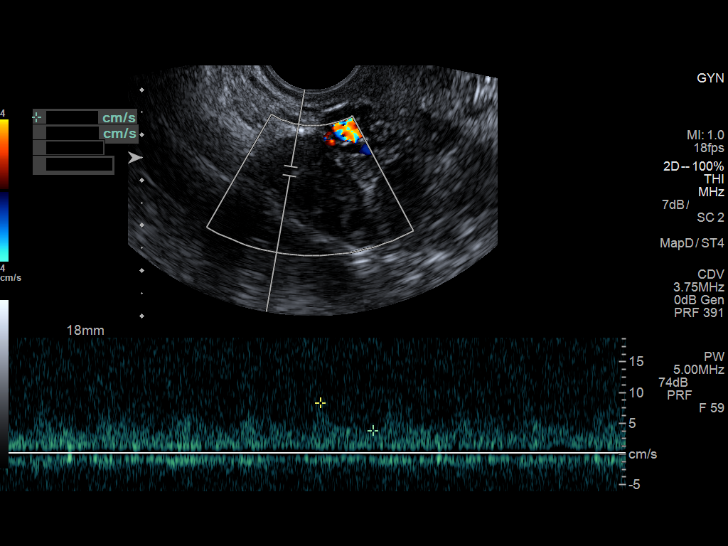
[im 57/85]
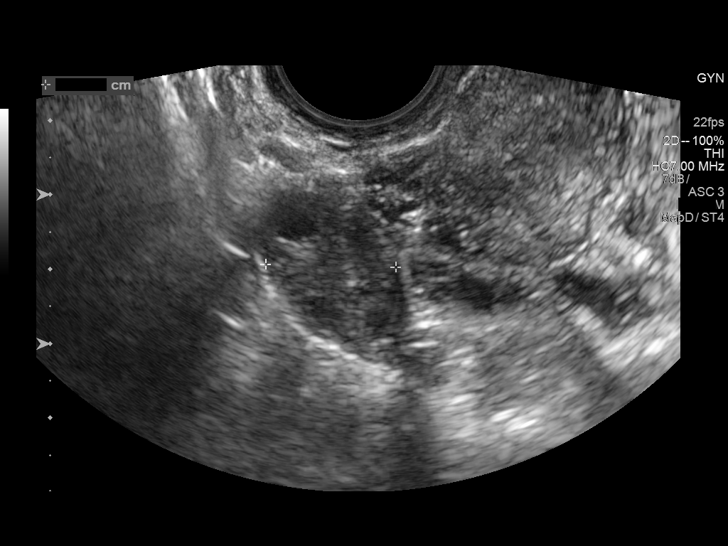
[im 64/85]
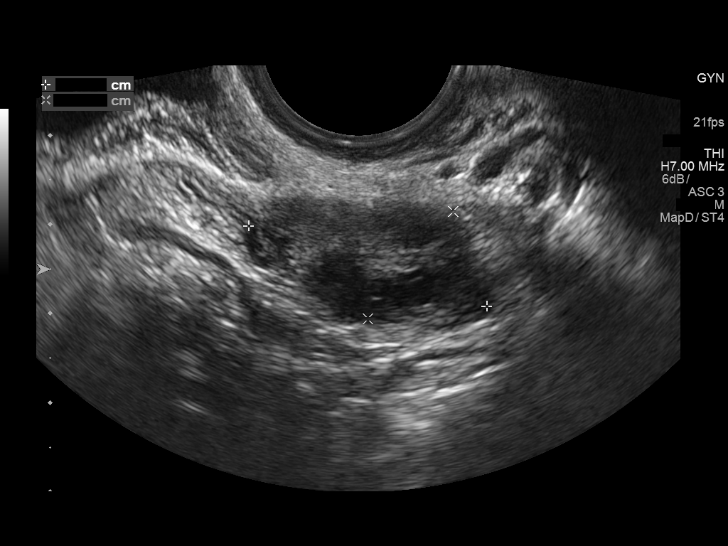
[im 71/85]
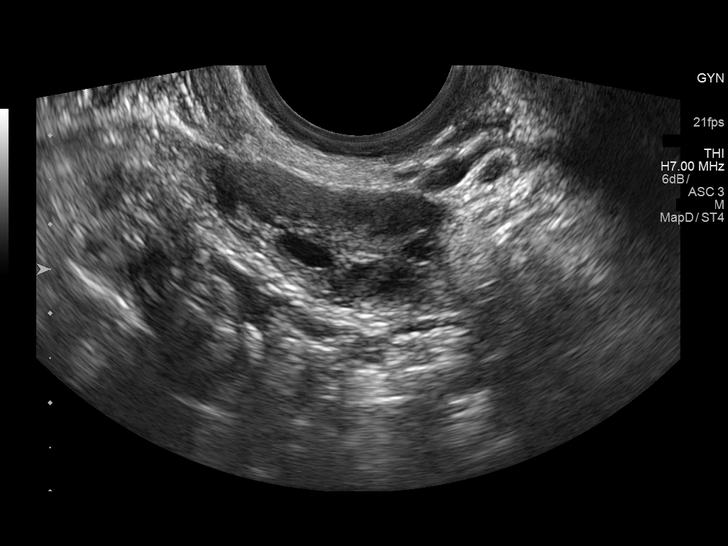
[im 78/85]
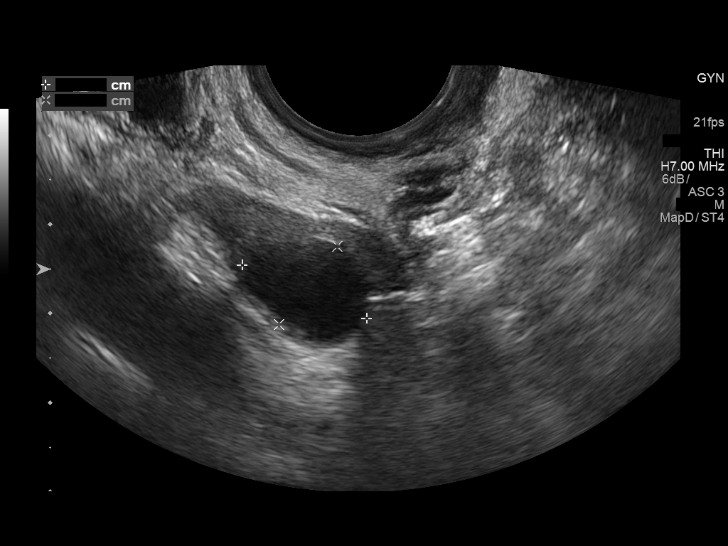
[im 85/85]
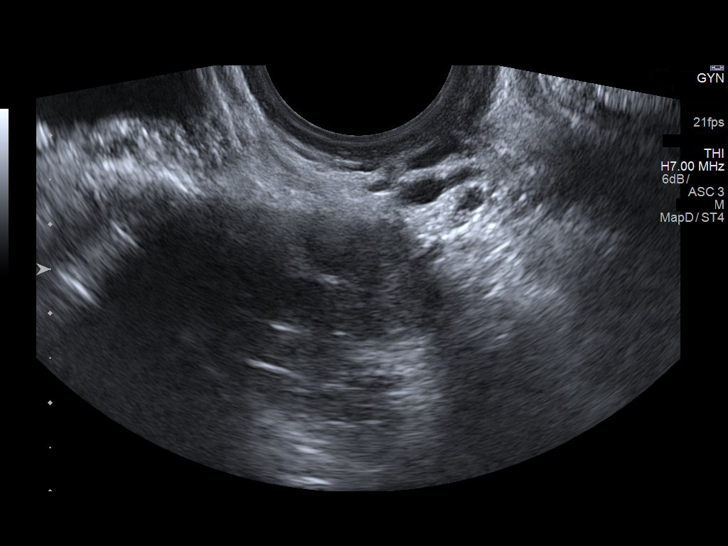

[13 of 25 positions shown; findings below may reference images not displayed]

FINDINGS: Uterus

Measurements: 6.6 x 3.3 x 3.9 cm. Uterus is anteverted. No fibroids
or other mass visualized. Nabothian cysts in the cervix.

Endometrium

Thickness: 2 mm. Minimal fluid in the endocervical region. Otherwise
normal appearance.

Right ovary

Measurements: 3.4 x 1.7 x 1.8 cm. Normal appearance/no adnexal mass.

Left ovary

Measurements: 2.8 x 1.5 x 2.5 cm. Normal appearance/no adnexal mass.

Pulsed Doppler evaluation of both ovaries demonstrates normal
low-resistance arterial and venous waveforms.

Other findings

No abnormal free fluid.
IMPRESSION: Normal ultrasound appearance of the uterus and ovaries. No evidence
of ovarian mass or torsion. Small amount of fluid in the
endocervical canal may be physiologic.

## 2020-12-23 ENCOUNTER — Other Ambulatory Visit: Payer: Self-pay

## 2020-12-23 ENCOUNTER — Emergency Department (HOSPITAL_BASED_OUTPATIENT_CLINIC_OR_DEPARTMENT_OTHER)
Admission: EM | Admit: 2020-12-23 | Discharge: 2020-12-23 | Disposition: A | Discharge status: Home / Self Care | Payer: Medicaid Other | Attending: Emergency Medicine | Admitting: Emergency Medicine

## 2020-12-23 ENCOUNTER — Encounter (HOSPITAL_BASED_OUTPATIENT_CLINIC_OR_DEPARTMENT_OTHER): Payer: Self-pay

## 2020-12-23 DIAGNOSIS — R1110 Vomiting, unspecified: Secondary | ICD-10-CM

## 2020-12-23 DIAGNOSIS — R197 Diarrhea, unspecified: Secondary | ICD-10-CM | POA: Diagnosis not present

## 2020-12-23 DIAGNOSIS — R109 Unspecified abdominal pain: Secondary | ICD-10-CM | POA: Diagnosis present

## 2020-12-23 DIAGNOSIS — R1013 Epigastric pain: Secondary | ICD-10-CM | POA: Diagnosis not present

## 2020-12-23 MED ORDER — ONDANSETRON 4 MG PO TBDP
8.0000 mg | ORAL_TABLET | Freq: Once | ORAL | Status: AC
  Administered 2020-12-23: 8 mg via ORAL
  Filled 2020-12-23: qty 2

## 2020-12-23 MED ORDER — DICYCLOMINE HCL 20 MG PO TABS: 20.0000 mg | ORAL_TABLET | Freq: Two times a day (BID) | ORAL | 0 refills | Status: DC | PRN

## 2020-12-23 MED ORDER — ONDANSETRON 4 MG PO TBDP: ORAL_TABLET | ORAL | 0 refills | Status: DC

## 2020-12-23 MED ORDER — DICYCLOMINE HCL 10 MG PO CAPS
10.0000 mg | ORAL_CAPSULE | Freq: Once | ORAL | Status: AC
  Administered 2020-12-23: 10 mg via ORAL
  Filled 2020-12-23: qty 1

## 2020-12-23 NOTE — ED Triage Notes (Signed)
Pt c/o abdominal cramping and diarrhea since last night. Has had vomiting also.

## 2020-12-24 NOTE — ED Provider Notes (Signed)
[facility] EMERGENCY DEPARTMENT Provider Note   CSN: [id number] Arrival date & time: 12/23/20  0447     History Chief Complaint  Patient presents with   Abdominal Pain    Dorothy Sexton is a 25 y.o. female.  25 yo F here with vomiting, diarrhea and abdominal cramping. Daughter with same. Started tonight. Went to a birthday party over the weekend but does not know of anybody from that birthday party is sick.  Her and her daughter eat different foods.  I have both been afebrile.  Neither one of them had blood in her stool or other emesis.  Patient states this feels like food poisoning she has had in the past.  No focal abdominal pain.  No urinary symptoms.   Abdominal Pain     History reviewed. No pertinent past medical history.  There are no problems to display for this patient.   History reviewed. No pertinent surgical history.   OB History   No obstetric history on file.     Family History  Problem Relation Age of Onset   Stroke Other    Diabetes Other    Hypertension Other    Diabetes Mother     Social History   Tobacco Use   Smoking status: Never   Smokeless tobacco: Never  Vaping Use   Vaping Use: Never used  Substance Use Topics   Alcohol use: Yes    Comment: occ   Drug use: No    Home Medications Prior to Admission medications   Medication Sig Start Date End Date Taking? Authorizing Provider  dicyclomine (BENTYL) 20 MG tablet Take 1 tablet (20 mg total) by mouth 2 (two) times daily as needed for spasms (abdominal cramping). 12/23/20  Yes Ronold Hardgrove, Corene Cornea, MD  ondansetron (ZOFRAN-ODT) 4 MG disintegrating tablet 4mg  ODT q4 hours prn nausea/vomit 12/23/20  Yes Roshan Roback, Corene Cornea, MD  cephALEXin (KEFLEX) 500 MG capsule Take 1 capsule (500 mg total) by mouth 2 (two) times daily. 08/06/18   Ripley Fraise, MD  etonogestrel (NEXPLANON) 68 MG IMPL implant 1 each by Subdermal route once. Placed in 05/2013    [provider]    Allergies     Patient has no known allergies.  Review of Systems   Review of Systems  Gastrointestinal:  Positive for abdominal pain.  All other systems reviewed and are negative.  Physical Exam Updated Vital Signs BP 127/82 (BP Location: Right Arm)   Pulse 95   Temp 98.6 F (37 C) (Oral)   Resp 18   Ht 5' 5.5" (1.664 m)   Wt 90.7 kg   LMP  (LMP Unknown)   SpO2 100%   BMI 32.78 kg/m   Physical Exam Vitals and nursing note reviewed.  Constitutional:      Appearance: She is well-developed.  HENT:     Head: Normocephalic and atraumatic.     Mouth/Throat:     Mouth: Mucous membranes are moist.     Pharynx: Oropharynx is clear.  Eyes:     Pupils: Pupils are equal, round, and reactive to light.  Cardiovascular:     Rate and Rhythm: Normal rate and regular rhythm.  Pulmonary:     Effort: Pulmonary effort is normal. No respiratory distress.     Breath sounds: No stridor.  Abdominal:     General: There is no distension.     Palpations: There is no shifting dullness or fluid wave.     Tenderness: There is no abdominal tenderness.  Musculoskeletal:  Cervical back: Normal range of motion.  Skin:    General: Skin is warm and dry.  Neurological:     General: No focal deficit present.     Mental Status: She is alert.    ED Results / Procedures / Treatments   Labs (all labs ordered are listed, but only abnormal results are displayed) Labs Reviewed - No data to display  EKG None  Radiology No results found.  Procedures Procedures   Medications Ordered in ED Medications  ondansetron (ZOFRAN-ODT) disintegrating tablet 8 mg (8 mg Oral Given 12/23/20 0536)  dicyclomine (BENTYL) capsule 10 mg (10 mg Oral Given 12/23/20 6503)    ED Course  I have reviewed the triage vital signs and the nursing notes.  Pertinent labs & imaging results that were available during my care of the patient were reviewed by me and considered in my medical decision making (see chart for details).     MDM Rules/Calculators/A&P                         Overall patient appears well.  She is well-hydrated without any evidence of significant dehydration.  She has no focal tenderness to suggest appendicitis, cholecystitis, pancreatitis bowel obstruction, colitis or other bacterial/emergent causes for symptoms.  At this time I suspect this gastroenteritis likely viral.  May have picked it up at the birthday party.  Does not seem like food poisoning.  Either way supportive care is warranted.  She is tolerating p.o. with improved symptoms prior to discharge.  Will DC on same meds given here.  Return precautions provided.  Final Clinical Impression(s) / ED Diagnoses Final diagnoses:  Vomiting, unspecified vomiting type, unspecified whether nausea present  Epigastric pain    Rx / DC Orders ED Discharge Orders          Ordered    dicyclomine (BENTYL) 20 MG tablet  2 times daily PRN        12/23/20 0658    ondansetron (ZOFRAN-ODT) 4 MG disintegrating tablet        12/23/20 0658             Shontay Wallner, Barbara Cower, MD 12/24/20 [phone]

## 2021-03-27 ENCOUNTER — Other Ambulatory Visit: Payer: Self-pay

## 2021-03-27 ENCOUNTER — Encounter: Payer: Self-pay | Admitting: Plastic Surgery

## 2021-03-27 ENCOUNTER — Ambulatory Visit (INDEPENDENT_AMBULATORY_CARE_PROVIDER_SITE_OTHER): Payer: Medicaid Other | Admitting: Plastic Surgery

## 2021-03-27 VITALS — BP 151/83 | HR 98 | Ht 65.5 in | Wt 225.2 lb

## 2021-03-27 DIAGNOSIS — N62 Hypertrophy of breast: Secondary | ICD-10-CM | POA: Diagnosis not present

## 2021-03-27 DIAGNOSIS — M545 Low back pain, unspecified: Secondary | ICD-10-CM | POA: Diagnosis not present

## 2021-03-27 DIAGNOSIS — M546 Pain in thoracic spine: Secondary | ICD-10-CM

## 2021-03-27 DIAGNOSIS — M4004 Postural kyphosis, thoracic region: Secondary | ICD-10-CM

## 2021-03-27 NOTE — Progress Notes (Signed)
? ?Referring Provider ?[facility] ?[address] ?[place],  [place] [address]  ? ?CC:  ?Chief Complaint  ?Patient presents with  ? Consult  ?   ? ?Dorothy Sexton is an 26 y.o. female.  ?HPI: Patient presents to discuss breast reduction.  She has had years of back pain, neck pain shoulder grooving related to her large breast.  She is tried over-the-counter medications, warm packs, cold packs and supportive bras with little relief.  She also gets rashes beneath her breast that been refractory to over-the-counter treatments.  She is currently an H cup and wants to be around a C cup.  She has a paternal grandmother with a history of breast cancer but no previous breast biopsies or procedures or self.  She does not smoke and is not a diabetic.  She has been to a chiropractor with no sustained relief of her back pain. ? ?No Known Allergies ? ?Outpatient Encounter Medications as of 03/27/2021  ?Medication Sig  ? etonogestrel (NEXPLANON) 68 MG IMPL implant 1 each by Subdermal route once. Placed in 05/2013  ? [DISCONTINUED] cephALEXin (KEFLEX) 500 MG capsule Take 1 capsule (500 mg total) by mouth 2 (two) times daily.  ? [DISCONTINUED] dicyclomine (BENTYL) 20 MG tablet Take 1 tablet (20 mg total) by mouth 2 (two) times daily as needed for spasms (abdominal cramping).  ? [DISCONTINUED] ondansetron (ZOFRAN-ODT) 4 MG disintegrating tablet 4mg  ODT q4 hours prn nausea/vomit  ? ?No facility-administered encounter medications on file as of 03/27/2021.  ?  ? ?No past medical history on file. ? ?No past surgical history on file. ? ?Family History  ?Problem Relation Age of Onset  ? Stroke Other   ? Diabetes Other   ? Hypertension Other   ? Diabetes Mother   ? ? ?Social History  ? ?Social History Narrative  ? Not on file  ?  ? ?Review of Systems ?General: Denies fevers, chills, weight loss ?CV: Denies chest pain, shortness of breath, palpitations ? ?Physical Exam ?Vitals with BMI 03/27/2021 12/23/2020 06/01/2019  ?Height 5' 5.5"  5' 5.5" 5' 5.5"  ?Weight 225 lbs 3 oz 200 lbs 214 lbs  ?BMI 36.89 32.76 35.06  ?Systolic 151 127 06/03/2019  ?Diastolic 83 82 88  ?Pulse 98 95 108  ?  ?General:  No acute distress,  Alert and oriented, Non-Toxic, Normal speech and affect ?Breast: She has grade 3 ptosis.  Sternal notch to nipple is 40 cm on the right and 39 cm on the left.  Nipple to fold is 23 cm on the right and 22 cm on the left.  No obvious scars or masses.  She is larger on the right. ? ?Assessment/Plan ?The patient has bilateral symptomatic macromastia.  She is a good candidate for a breast reduction.  She is interested in pursuing surgical treatment.  She has tried supportive garments and fitted bras with no relief.  The details of breast reduction surgery were discussed.  I explained the procedure in detail along the with the expected scars.  The risks were discussed in detail and include bleeding, infection, damage to surrounding structures, need for additional procedures, nipple loss, change in nipple sensation, persistent pain, contour irregularities and asymmetries.  I explained that breast feeding is often not possible after breast reduction surgery.  We discussed the expected postoperative course with an overall recovery period of about 1 month.  She demonstrated full understanding of all risks.  We discussed her personal risk factors.  The patient is interested in pursuing  surgical treatment. ? ?I anticipate approximately 900g of tissue removed from each side. ? ? ?Dorothy Sexton ?03/27/2021, 1:57 PM  ? ? ?  ?

## 2021-04-24 ENCOUNTER — Telehealth: Payer: Self-pay | Admitting: Plastic Surgery

## 2021-04-24 NOTE — Telephone Encounter (Signed)
Called patient to advise that we received notification from her insurance and they have denied the breast reduction authorization due to the fact she does not have a deformity from birth and she does not have breast cancer. Patient would like a quote, but will also look into a new insurance plan. Patient knows to give [place] a call when she has the new insurance plan and card so we can submit authorization for new plan.  ?

## 2021-08-15 ENCOUNTER — Emergency Department (HOSPITAL_BASED_OUTPATIENT_CLINIC_OR_DEPARTMENT_OTHER)
Admission: EM | Admit: 2021-08-15 | Discharge: 2021-08-15 | Disposition: A | Discharge status: Home / Self Care | Payer: Medicaid Other | Attending: Emergency Medicine | Admitting: Emergency Medicine

## 2021-08-15 ENCOUNTER — Encounter (HOSPITAL_BASED_OUTPATIENT_CLINIC_OR_DEPARTMENT_OTHER): Payer: Self-pay

## 2021-08-15 ENCOUNTER — Other Ambulatory Visit: Payer: Self-pay

## 2021-08-15 DIAGNOSIS — J029 Acute pharyngitis, unspecified: Secondary | ICD-10-CM | POA: Diagnosis present

## 2021-08-15 DIAGNOSIS — U071 COVID-19: Secondary | ICD-10-CM | POA: Diagnosis not present

## 2021-08-15 LAB — SARS CORONAVIRUS 2 BY RT PCR: SARS Coronavirus 2 by RT PCR: POSITIVE — AB

## 2021-08-15 NOTE — Discharge Instructions (Signed)
Your COVID test is still pending.  It is likely going to be positive since your daughter obtained a positive test today.  Use over-the-counter remedies for your symptoms and return with any shortness of breath, fevers you cannot treat, inability to hold down food or drink or any worsening symptoms.

## 2021-08-15 NOTE — ED Provider Notes (Signed)
[facility] EMERGENCY DEPARTMENT Provider Note   CSN: [id number] Arrival date & time: 08/15/21  1705     History  Chief Complaint  Patient presents with   Sore Throat    Dorothy Sexton is a 26 y.o. female presenting with concern for COVID.  She has had viral symptoms such as congestion, sore throat, headache and abdominal upset since Sunday.  She believes she and her daughter have COVID.  Did not take a test at home.   Sore Throat       Home Medications Prior to Admission medications   Medication Sig Start Date End Date Taking? Authorizing Provider  etonogestrel (NEXPLANON) 68 MG IMPL implant 1 each by Subdermal route once. Placed in 05/2013    [provider]      Allergies    Patient has no known allergies.    Review of Systems   Review of Systems  Physical Exam Updated Vital Signs BP 140/85 (BP Location: Right Arm)   Pulse 99   Temp 98.7 F (37.1 C) (Oral)   Resp 20   Ht 5\' 5"  (1.651 m)   Wt 102.1 kg   SpO2 99%   BMI 37.44 kg/m  Physical Exam Vitals and nursing note reviewed.  Constitutional:      General: She is not in acute distress.    Appearance: Normal appearance. She is not ill-appearing.  HENT:     Head: Normocephalic and atraumatic.     Nose: No congestion or rhinorrhea.     Mouth/Throat:     Mouth: Mucous membranes are moist.     Pharynx: Oropharynx is clear.     Tonsils: No tonsillar exudate or tonsillar abscesses.  Eyes:     General: No scleral icterus.    Conjunctiva/sclera: Conjunctivae normal.  Cardiovascular:     Rate and Rhythm: Normal rate and regular rhythm.  Pulmonary:     Effort: Pulmonary effort is normal. No respiratory distress.     Breath sounds: No wheezing.  Abdominal:     Palpations: Abdomen is soft.     Tenderness: There is no abdominal tenderness.  Skin:    Findings: No rash.  Neurological:     Mental Status: She is alert.  Psychiatric:        Mood and Affect: Mood normal.     ED Results  / Procedures / Treatments   Labs (all labs ordered are listed, but only abnormal results are displayed) Labs Reviewed  SARS CORONAVIRUS 2 BY RT PCR - Abnormal; Notable for the following components:      Result Value   SARS Coronavirus 2 by RT PCR POSITIVE (*)    All other components within normal limits  GROUP A STREP BY PCR    EKG None  Radiology No results found.  Procedures Procedures   Medications Ordered in ED Medications - No data to display  ED Course/ Medical Decision Making/ A&P                           Medical Decision Making  26 year old female presenting today with viral symptoms.  Believes she may have COVID.  Refused nursing strep swab.  Testing: COVID-positive.  She is on day 6 and her symptoms are improving.  We discussed over-the-counter care and she is agreeable to discharge.  We will give her a work note but we discussed that she is likely out of the window of contagion.  Return precautions discussed  Final Clinical Impression(s) / ED Diagnoses Final diagnoses:  COVID-19    Rx / DC Orders ED Discharge Orders     None      Results and diagnoses were explained to the patient. Return precautions discussed in full. Patient had no additional questions and expressed complete understanding.   This chart was dictated using voice recognition software.  Despite best efforts to proofread,  errors can occur which can change the documentation meaning.     Woodroe Chen 08/15/21 Farrell Ours, MD 08/17/21 [phone]

## 2021-08-15 NOTE — ED Triage Notes (Signed)
Pt reports sore throat and nasal congestion since Sunday.

## 2021-10-21 ENCOUNTER — Telehealth: Payer: Self-pay | Admitting: Plastic Surgery

## 2021-10-21 NOTE — Telephone Encounter (Signed)
LVM and MyChart message that appt on 10/20 has been moved up to 1pm instead of 1:15 due to scheduling conflict.

## 2021-10-30 ENCOUNTER — Institutional Professional Consult (permissible substitution): Payer: Medicaid Other | Admitting: Plastic Surgery

## 2021-11-06 ENCOUNTER — Telehealth: Payer: Self-pay | Admitting: Plastic Surgery

## 2021-11-06 NOTE — Telephone Encounter (Signed)
Called and left vmail about needing to move apt up to an earlier time due to provider going into emergency sx. Call 2x bc it goes straight to vmail.

## 2021-11-07 ENCOUNTER — Institutional Professional Consult (permissible substitution): Payer: Medicaid Other | Admitting: Plastic Surgery

## 2022-04-02 ENCOUNTER — Emergency Department (HOSPITAL_BASED_OUTPATIENT_CLINIC_OR_DEPARTMENT_OTHER)
Admission: EM | Admit: 2022-04-02 | Discharge: 2022-04-02 | Disposition: A | Discharge status: Home / Self Care | Payer: Medicaid Other | Attending: Emergency Medicine | Admitting: Emergency Medicine

## 2022-04-02 ENCOUNTER — Encounter (HOSPITAL_BASED_OUTPATIENT_CLINIC_OR_DEPARTMENT_OTHER): Payer: Self-pay

## 2022-04-02 DIAGNOSIS — R21 Rash and other nonspecific skin eruption: Secondary | ICD-10-CM | POA: Diagnosis present

## 2022-04-02 MED ORDER — FAMOTIDINE 20 MG PO TABS
20.0000 mg | ORAL_TABLET | Freq: Once | ORAL | Status: AC
  Administered 2022-04-02: 20 mg via ORAL
  Filled 2022-04-02: qty 1

## 2022-04-02 MED ORDER — FAMOTIDINE 20 MG PO TABS: 20.0000 mg | ORAL_TABLET | Freq: Every day | ORAL | 0 refills | Status: AC

## 2022-04-02 MED ORDER — PREDNISONE 20 MG PO TABS: 40.0000 mg | ORAL_TABLET | Freq: Every day | ORAL | 0 refills | Status: AC

## 2022-04-02 MED ORDER — DIPHENHYDRAMINE HCL 25 MG PO TABS: 25.0000 mg | ORAL_TABLET | Freq: Four times a day (QID) | ORAL | 0 refills | Status: AC | PRN

## 2022-04-02 MED ORDER — PREDNISONE 50 MG PO TABS
60.0000 mg | ORAL_TABLET | Freq: Once | ORAL | Status: AC
  Administered 2022-04-02: 60 mg via ORAL
  Filled 2022-04-02: qty 1

## 2022-04-02 NOTE — ED Provider Notes (Signed)
[facility]   CSN: [id number] Arrival date & time: 04/02/22  [id number]     History  Chief Complaint  Patient presents with   Rash    Dorothy Sexton is a 27 y.o. female with no significant past medical history who presents to the ED due to intermittent pruritus associated with hives x 3 days.  Patient states she developed pruritus on her arms and face and then scratches the area and develops hives.  Denies fever and chills.  No new medications or products.  Patient does not currently have rash at this time.  No previous history of this.  She took Benadryl last night with improvement in symptoms.  History obtained from patient and past medical records. No interpreter used during encounter.       Home Medications Prior to Admission medications   Medication Sig Start Date End Date Taking? Authorizing Provider  diphenhydrAMINE (BENADRYL) 25 MG tablet Take 1 tablet (25 mg total) by mouth every 6 (six) hours as needed. 04/02/22  Yes Yonis Carreon, Druscilla Brownie, PA-C  famotidine (PEPCID) 20 MG tablet Take 1 tablet (20 mg total) by mouth daily for 5 days. 04/02/22 04/07/22 Yes Sae Handrich, Druscilla Brownie, PA-C  predniSONE (DELTASONE) 20 MG tablet Take 2 tablets (40 mg total) by mouth daily for 5 days. 04/02/22 04/07/22 Yes Thatcher Doberstein, Druscilla Brownie, PA-C  etonogestrel (NEXPLANON) 68 MG IMPL implant 1 each by Subdermal route once. Placed in 05/2013    [provider]      Allergies    Patient has no known allergies.    Review of Systems   Review of Systems  Constitutional:  Negative for fever.  Skin:  Positive for color change and rash.    Physical Exam Updated Vital Signs BP (!) 146/76 (BP Location: Right Arm)   Pulse 89   Temp 98.6 F (37 C) (Oral)   Resp 18   Ht '5\' 5"'$  (1.651 m)   SpO2 98%   BMI 37.44 kg/m  Physical Exam Vitals and nursing note reviewed.  Constitutional:      General: She is not in acute distress.    Appearance: She  is not ill-appearing.  HENT:     Head: Normocephalic.  Eyes:     Pupils: Pupils are equal, round, and reactive to light.  Cardiovascular:     Rate and Rhythm: Normal rate and regular rhythm.     Pulses: Normal pulses.     Heart sounds: Normal heart sounds. No murmur heard.    No friction rub. No gallop.  Pulmonary:     Effort: Pulmonary effort is normal.     Breath sounds: Normal breath sounds.     Comments: Airway patent Abdominal:     General: Abdomen is flat. There is no distension.     Palpations: Abdomen is soft.     Tenderness: There is no abdominal tenderness. There is no guarding or rebound.  Musculoskeletal:        General: Normal range of motion.     Cervical back: Neck supple.  Skin:    General: Skin is warm and dry.     Comments: No rash  Neurological:     General: No focal deficit present.     Mental Status: She is alert.  Psychiatric:        Mood and Affect: Mood normal.        Behavior: Behavior normal.     ED Results / Procedures / Treatments  Labs (all labs ordered are listed, but only abnormal results are displayed) Labs Reviewed - No data to display  EKG None  Radiology No results found.  Procedures Procedures    Medications Ordered in ED Medications  predniSONE (DELTASONE) tablet 60 mg (has no administration in time range)  famotidine (PEPCID) tablet 20 mg (has no administration in time range)    ED Course/ Medical Decision Making/ A&P                             Medical Decision Making  27 year old female presents to the ED due to pruritus and rash to arms and face x 3 days.  No new medications or products.  No known allergies.  Denies fever and chills.  Upon arrival, stable vitals.  Patient in no acute distress.  Unremarkable physical exam.  No urticaria or rash throughout body.  Airway patent.  Lungs clear to auscultation bilaterally.  No evidence of anaphylaxis. unknown etiology of rash however, likely allergic in nature.  Patient  treated with prednisone and Pepcid here in the ED.  Discharged with 5 days of prednisone, Pepcid, Benadryl. Follow-up with PCP if symptoms do not improve. Strict ED precautions discussed with patient. Patient states understanding and agrees to plan. Patient discharged home in no acute distress and stable vitals  Has PCP       Final Clinical Impression(s) / ED Diagnoses Final diagnoses:  Rash    Rx / DC Orders ED Discharge Orders          Ordered    predniSONE (DELTASONE) 20 MG tablet  Daily        04/02/22 0949    famotidine (PEPCID) 20 MG tablet  Daily        04/02/22 0949    diphenhydrAMINE (BENADRYL) 25 MG tablet  Every 6 hours PRN        04/02/22 0949              Suzy Bouchard, PA-C 04/02/22 0951    Audley Hose, MD 04/08/22 [phone]

## 2022-04-02 NOTE — ED Triage Notes (Signed)
States has had hives intermittently since Tuesday. No noticeable rash during triage. States arms are itching.

## 2022-04-02 NOTE — Discharge Instructions (Signed)
It was a pleasure taking care of you today.  As discussed, I suspect your rash is allergic in nature.  I am sending you home with steroids, Benadryl, and Pepcid.  Take for the next 5 days.  Follow-up with PCP if symptoms do not improve over the next week.  Return to the ER for new or worsening symptoms.
